# Patient Record
Sex: Female | Born: 1985 | Race: White | Hispanic: Yes | Marital: Married | Smoking: Former smoker
Health system: Southern US, Community
[De-identification: ages and names within clinical notes are randomized; demographics above are authoritative.]

## PROBLEM LIST (undated history)

## (undated) DIAGNOSIS — K8020 Calculus of gallbladder without cholecystitis without obstruction: Secondary | ICD-10-CM

## (undated) DIAGNOSIS — D649 Anemia, unspecified: Secondary | ICD-10-CM

## (undated) HISTORY — DX: Anemia, unspecified: D64.9

## (undated) HISTORY — PX: BREAST ENHANCEMENT SURGERY: SHX7

## (undated) HISTORY — DX: Calculus of gallbladder without cholecystitis without obstruction: K80.20

---

## 2013-01-16 ENCOUNTER — Ambulatory Visit: Payer: Self-pay | Admitting: Gastroenterology

## 2013-01-23 ENCOUNTER — Ambulatory Visit: Payer: Self-pay | Admitting: Gastroenterology

## 2013-03-21 ENCOUNTER — Encounter: Payer: Self-pay | Admitting: Gastroenterology

## 2013-04-03 ENCOUNTER — Ambulatory Visit: Payer: Self-pay | Admitting: Gastroenterology

## 2013-04-08 ENCOUNTER — Telehealth: Payer: Self-pay | Admitting: Gastroenterology

## 2013-05-28 ENCOUNTER — Encounter: Payer: Self-pay | Admitting: *Deleted

## 2013-05-29 ENCOUNTER — Ambulatory Visit (INDEPENDENT_AMBULATORY_CARE_PROVIDER_SITE_OTHER): Payer: No Typology Code available for payment source | Admitting: Gastroenterology

## 2013-05-29 ENCOUNTER — Encounter: Payer: Self-pay | Admitting: Gastroenterology

## 2013-05-29 VITALS — BP 90/60 | HR 80 | Ht 60.0 in | Wt 134.1 lb

## 2013-05-29 DIAGNOSIS — K602 Anal fissure, unspecified: Secondary | ICD-10-CM

## 2013-05-29 DIAGNOSIS — K648 Other hemorrhoids: Secondary | ICD-10-CM | POA: Insufficient documentation

## 2013-05-29 DIAGNOSIS — R1011 Right upper quadrant pain: Secondary | ICD-10-CM

## 2013-05-29 DIAGNOSIS — R109 Unspecified abdominal pain: Secondary | ICD-10-CM

## 2013-05-29 MED ORDER — HYOSCYAMINE SULFATE ER 0.375 MG PO TBCR: EXTENDED_RELEASE_TABLET | ORAL | Status: DC

## 2013-05-29 MED ORDER — AMBULATORY NON FORMULARY MEDICATION: Status: DC

## 2013-05-29 NOTE — Assessment & Plan Note (Signed)
Rectal pain and bleeding is 2 to a fissure.  Recommendations #1 fiber supplementation #2 warm soaks #31% nitroglycerin ointment 4 times a day

## 2013-05-29 NOTE — Addendum Note (Signed)
Addended by: Marlowe KaysSTALLINGS, Aldred Mase M on: 05/29/2013 09:49 AM   Modules accepted: Orders

## 2013-05-29 NOTE — Progress Notes (Signed)
_                                                                                                                History of Present Illness:  28 year old female referred for evaluation of abdominal pain and rectal bleeding.  She has been symptomatic of both for at least 4 months.  She complains of pain in the right abdomen both in the right lower quadrant and radiated up to the upper quadrant and through and around to her back.  It is unrelated to meals or bowel movements.  It is described as a pounding pain that may occur spontaneously and occasionally awakens her.  By report abdominal ultrasound demonstrated cholelithiasis.  Lab work was pertinent for a mild microcytic anemia with hemoglobin 12.3.  LFTs were normal.  She complains of occasional immediate postprandial nausea with vomiting.  Bowels are erratic and consists of alternating episodes of constipation and diarrhea.  With a bowel movement she may have burning rectal discomfort and pass small clots or blood on the toilet tissue.  Upper endoscopy in December was normal.  Colonoscopy to the terminal ileum was also unremarkable.    Past Medical History  Diagnosis Date  . Anemia   . Gallstones    Past Surgical History  Procedure Laterality Date  . Breast enhancement surgery     family history includes Diabetes in her mother; Irritable bowel syndrome in her mother; Liver cancer in her maternal aunt; Ovarian cancer in her maternal aunt; Pancreatic cancer in her maternal aunt. Current Outpatient Prescriptions  Medication Sig Dispense Refill  . ondansetron (ZOFRAN-ODT) 8 MG disintegrating tablet Take 8 mg by mouth every 6 (six) hours as needed for nausea or vomiting.       No current facility-administered medications for this visit.   Allergies as of 05/29/2013 - Review Complete 05/29/2013  Allergen Reaction Noted  . Peanuts [peanut oil]  05/29/2013    reports that she has been smoking Cigarettes.  She has been smoking  about 0.10 packs per day. She has never used smokeless tobacco. She reports that she does not drink alcohol or use illicit drugs.     Review of Systems: Pertinent positive and negative review of systems were noted in the above HPI section. All other review of systems were otherwise negative.  Vital signs were reviewed in today's medical record Physical Exam: General: Well developed , well nourished, no acute distress Skin: anicteric Head: Normocephalic and atraumatic Eyes:  sclerae anicteric, EOMI Ears: Normal auditory acuity Mouth: No deformity or lesions Neck: Supple, no masses or thyromegaly Lungs: Clear throughout to auscultation Heart: Regular rate and rhythm; no murmurs, rubs or bruits Abdomen: Soft, and non distended. No masses, hepatosplenomegaly or hernias noted. Normal Bowel sounds.  There is mild tenderness in the right periumbilical area and right upper quadrant without guarding or rebound Rectal: There are no external abnormalities Musculoskeletal: Symmetrical with no gross deformities  Skin: No lesions on visible extremities Pulses:  Normal pulses noted Extremities: No clubbing, cyanosis,  edema or deformities noted Neurological: Alert oriented x 4, grossly nonfocal Cervical Nodes:  No significant cervical adenopathy Inguinal Nodes: No significant inguinal adenopathy Psychological:  Alert and cooperative. Normal mood and affect  Anoscopy was performed.  This demonstrated a fissure in the posterior midline and internal hemorrhoids  See Assessment and Plan under Problem List

## 2013-05-29 NOTE — Patient Instructions (Addendum)
You have been given a separate informational sheet regarding your tobacco use, the importance of quitting and local resources to help you quit. You have been scheduled for a [facility]IDA scan [facility] (1st floor) on 06/04/2013. Please arrive 15 minutes prior to your scheduled appointment at  1:61WR9:30am. Make certain not to have anything to eat or drink at least 6 hours prior to your test. Should this appointment date or time not work well for you, please call radiology scheduling at [phone].  _____________________________________________________________________ hepatobiliary (HIDA) scan is an imaging procedure used to diagnose problems in the liver, gallbladder and bile ducts. In the HIDA scan, a radioactive chemical or tracer is injected into a vein in your arm. The tracer is handled by the liver like bile. Bile is a fluid produced and excreted by your liver that helps your digestive system break down fats in the foods you eat. Bile is stored in your gallbladder and the gallbladder releases the bile when you eat a meal. A special nuclear medicine scanner (gamma camera) tracks the flow of the tracer from your liver into your gallbladder and small intestine.  During your HIDA scan  You'll be asked to change into a hospital gown before your HIDA scan begins. Your health care team will position you on a table, usually on your back. The radioactive tracer is then injected into a vein in your arm.The tracer travels through your bloodstream to your liver, where it's taken up by the bile-producing cells. The radioactive tracer travels with the bile from your liver into your gallbladder and through your bile ducts to your small intestine.You may feel some pressure while the radioactive tracer is injected into your vein. As you lie on the table, a special gamma camera is positioned over your abdomen taking pictures of the tracer as it moves through your body. The gamma camera takes pictures continually for about  an hour. You'll need to keep still during the HIDA scan. This can become uncomfortable, but you may find that you can lessen the discomfort by taking deep breaths and thinking about other things. Tell your health care team if you're uncomfortable. The radiologist will watch on a computer the progress of the radioactive tracer through your body. The HIDA scan may be stopped when the radioactive tracer is seen in the gallbladder and enters your small intestine. This typically takes about an hour. In some cases extra imaging will be performed if original images aren't satisfactory, if morphine is given to help visualize the gallbladder or if the medication CCK is given to look at the contraction of the gallbladder. This test typically takes 2 hours to complete. _____________________________________________________High-Fiber Diet Fiber is found in fruits, vegetables, and grains. A high-fiber diet encourages the addition of more whole grains, legumes, fruits, and vegetables in your diet. The recommended amount of fiber for adult males is 38 g per day. For adult females, it is 25 g per day. Pregnant and lactating women should get 28 g of fiber per day. If you have a digestive or bowel problem, ask your caregiver for advice before adding high-fiber foods to your diet. Eat a variety of high-fiber foods instead of only a select few type of foods.  PURPOSE  To increase stool bulk.  To make bowel movements more regular to prevent constipation.  To lower cholesterol.  To prevent overeating. WHEN IS THIS DIET USED?  It may be used if you have constipation and hemorrhoids.  It may be used if you have  uncomplicated diverticulosis (intestine condition) and irritable bowel syndrome.  It may be used if you need help with weight management.  It may be used if you want to add it to your diet as a protective measure against atherosclerosis, diabetes, and cancer. SOURCES OF FIBER  Whole-grain breads and  cereals.  Fruits, such as apples, oranges, bananas, berries, prunes, and pears.  Vegetables, such as green peas, carrots, sweet potatoes, beets, broccoli, cabbage, spinach, and artichokes.  Legumes, such split peas, soy, lentils.  Almonds. FIBER CONTENT IN FOODS Starches and Grains / Dietary Fiber (g)  Cheerios, 1 cup / 3 g  Corn Flakes cereal, 1 cup / 0.7 g  Rice crispy treat cereal, 1 cup / 0.3 g  Instant oatmeal (cooked),  cup / 2 g  Frosted wheat cereal, 1 cup / 5.1 g  Brown, long-grain rice (cooked), 1 cup / 3.5 g  White, long-grain rice (cooked), 1 cup / 0.6 g  Enriched macaroni (cooked), 1 cup / 2.5 g Legumes / Dietary Fiber (g)  Baked beans (canned, plain, or vegetarian),  cup / 5.2 g  Kidney beans (canned),  cup / 6.8 g  Pinto beans (cooked),  cup / 5.5 g Breads and Crackers / Dietary Fiber (g)  Plain or honey graham crackers, 2 squares / 0.7 g  Saltine crackers, 3 squares / 0.3 g  Plain, salted pretzels, 10 pieces / 1.8 g  Whole-wheat bread, 1 slice / 1.9 g  White bread, 1 slice / 0.7 g  Raisin bread, 1 slice / 1.2 g  Plain bagel, 3 oz / 2 g  Flour tortilla, 1 oz / 0.9 g  Corn tortilla, 1 small / 1.5 g  Hamburger or hotdog bun, 1 small / 0.9 g Fruits / Dietary Fiber (g)  Apple with skin, 1 medium / 4.4 g  Sweetened applesauce,  cup / 1.5 g  Banana,  medium / 1.5 g  Grapes, 10 grapes / 0.4 g  Orange, 1 small / 2.3 g  Raisin, 1.5 oz / 1.6 g  Melon, 1 cup / 1.4 g Vegetables / Dietary Fiber (g)  Green beans (canned),  cup / 1.3 g  Carrots (cooked),  cup / 2.3 g  Broccoli (cooked),  cup / 2.8 g  Peas (cooked),  cup / 4.4 g  Mashed potatoes,  cup / 1.6 g  Lettuce, 1 cup / 0.5 g  Corn (canned),  cup / 1.6 g  Tomato,  cup / 1.1 g Document Released: 01/31/2005 Document Revised: 08/02/2011 Document Reviewed: 05/05/2011 Exit[employer] Patient Information 2014 [place]   _______________

## 2013-05-29 NOTE — Assessment & Plan Note (Signed)
Abdominal pain is nonspecific.  While she has gallbladder stones I am not certain whether she is symptomatic from this.  Recommendations #1 HIDA scan #2 trial of hyomax

## 2013-06-04 ENCOUNTER — Ambulatory Visit (HOSPITAL_COMMUNITY)
Admission: RE | Admit: 2013-06-04 | Discharge: 2013-06-04 | Disposition: A | Discharge status: Home / Self Care | Payer: No Typology Code available for payment source | Source: Ambulatory Visit | Attending: Gastroenterology | Admitting: Gastroenterology

## 2013-06-04 DIAGNOSIS — R109 Unspecified abdominal pain: Secondary | ICD-10-CM | POA: Insufficient documentation

## 2013-06-04 MED ORDER — TECHNETIUM TC 99M MEBROFENIN IV KIT
5.5000 | PACK | Freq: Once | INTRAVENOUS | Status: AC | PRN
  Administered 2013-06-04: 6 via INTRAVENOUS

## 2013-06-04 MED ORDER — SINCALIDE 5 MCG IJ SOLR
0.0200 ug/kg | Freq: Once | INTRAMUSCULAR | Status: AC
  Administered 2013-06-04: 1.2 ug via INTRAVENOUS

## 2013-06-05 ENCOUNTER — Other Ambulatory Visit: Payer: Self-pay

## 2013-06-05 DIAGNOSIS — R109 Unspecified abdominal pain: Secondary | ICD-10-CM

## 2013-06-11 NOTE — Telephone Encounter (Signed)
Pt seen

## 2013-06-14 ENCOUNTER — Other Ambulatory Visit: Payer: Self-pay

## 2013-06-14 ENCOUNTER — Encounter (HOSPITAL_COMMUNITY): Payer: Self-pay

## 2013-06-14 ENCOUNTER — Ambulatory Visit (HOSPITAL_COMMUNITY)
Admission: RE | Admit: 2013-06-14 | Discharge: 2013-06-14 | Disposition: A | Discharge status: Home / Self Care | Payer: No Typology Code available for payment source | Source: Ambulatory Visit | Attending: Gastroenterology | Admitting: Gastroenterology

## 2013-06-14 ENCOUNTER — Other Ambulatory Visit: Payer: Self-pay | Admitting: Gastroenterology

## 2013-06-14 DIAGNOSIS — R109 Unspecified abdominal pain: Secondary | ICD-10-CM

## 2013-06-14 DIAGNOSIS — R1011 Right upper quadrant pain: Secondary | ICD-10-CM | POA: Insufficient documentation

## 2013-06-14 DIAGNOSIS — K829 Disease of gallbladder, unspecified: Secondary | ICD-10-CM

## 2013-06-14 MED ORDER — TECHNETIUM TC 99M MEBROFENIN IV KIT
5.2000 | PACK | Freq: Once | INTRAVENOUS | Status: AC | PRN
  Administered 2013-06-14: 5.2 via INTRAVENOUS

## 2013-06-28 ENCOUNTER — Telehealth: Payer: Self-pay | Admitting: Gastroenterology

## 2013-06-28 NOTE — Telephone Encounter (Signed)
Spoke with Dr. Arlyce DiceKaplan and patient should go to ED. Patient notified and she will go to ED.

## 2013-06-28 NOTE — Telephone Encounter (Signed)
Spoke with patient and she states she is having abdominal pain that is worse than before. She reports she took Hyoscyamine 3 tablets at one time and it did not help the pain. Told patient her prescription is for 1 tablet BID as needed not 3 tablets. She also reports nausea and vomiting this AM. She states she took her Zofran but it only helps for about an hour. She is asking for a different "pain medication." Please, advise.

## 2013-07-03 ENCOUNTER — Observation Stay (HOSPITAL_COMMUNITY)
Admission: EM | Admit: 2013-07-03 | Discharge: 2013-07-05 | Disposition: A | Discharge status: Home / Self Care | Payer: No Typology Code available for payment source | Attending: Surgery | Admitting: Surgery

## 2013-07-03 ENCOUNTER — Encounter (INDEPENDENT_AMBULATORY_CARE_PROVIDER_SITE_OTHER): Payer: Self-pay | Admitting: General Surgery

## 2013-07-03 ENCOUNTER — Encounter (HOSPITAL_COMMUNITY): Payer: Self-pay | Admitting: Emergency Medicine

## 2013-07-03 ENCOUNTER — Ambulatory Visit (INDEPENDENT_AMBULATORY_CARE_PROVIDER_SITE_OTHER): Payer: No Typology Code available for payment source | Admitting: General Surgery

## 2013-07-03 ENCOUNTER — Telehealth (INDEPENDENT_AMBULATORY_CARE_PROVIDER_SITE_OTHER): Payer: Self-pay | Admitting: General Surgery

## 2013-07-03 VITALS — BP 111/70 | HR 73 | Temp 98.1°F | Resp 16 | Ht 60.0 in | Wt 134.0 lb

## 2013-07-03 DIAGNOSIS — K8010 Calculus of gallbladder with chronic cholecystitis without obstruction: Secondary | ICD-10-CM

## 2013-07-03 DIAGNOSIS — Z79899 Other long term (current) drug therapy: Secondary | ICD-10-CM | POA: Insufficient documentation

## 2013-07-03 DIAGNOSIS — F17200 Nicotine dependence, unspecified, uncomplicated: Secondary | ICD-10-CM | POA: Insufficient documentation

## 2013-07-03 DIAGNOSIS — K819 Cholecystitis, unspecified: Secondary | ICD-10-CM | POA: Diagnosis present

## 2013-07-03 DIAGNOSIS — K648 Other hemorrhoids: Secondary | ICD-10-CM | POA: Insufficient documentation

## 2013-07-03 DIAGNOSIS — K8020 Calculus of gallbladder without cholecystitis without obstruction: Secondary | ICD-10-CM

## 2013-07-03 DIAGNOSIS — K602 Anal fissure, unspecified: Secondary | ICD-10-CM | POA: Insufficient documentation

## 2013-07-03 DIAGNOSIS — R1110 Vomiting, unspecified: Secondary | ICD-10-CM

## 2013-07-03 DIAGNOSIS — E876 Hypokalemia: Secondary | ICD-10-CM | POA: Insufficient documentation

## 2013-07-03 LAB — COMPREHENSIVE METABOLIC PANEL
ALK PHOS: 94 U/L (ref 39–117)
ALT: 20 U/L (ref 0–35)
AST: 19 U/L (ref 0–37)
Albumin: 4.3 g/dL (ref 3.5–5.2)
BILIRUBIN TOTAL: 0.3 mg/dL (ref 0.3–1.2)
BUN: 10 mg/dL (ref 6–23)
CHLORIDE: 100 meq/L (ref 96–112)
CO2: 20 meq/L (ref 19–32)
CREATININE: 0.57 mg/dL (ref 0.50–1.10)
Calcium: 9.2 mg/dL (ref 8.4–10.5)
GLUCOSE: 85 mg/dL (ref 70–99)
POTASSIUM: 3.4 meq/L — AB (ref 3.7–5.3)
Sodium: 136 mEq/L — ABNORMAL LOW (ref 137–147)
Total Protein: 8.2 g/dL (ref 6.0–8.3)

## 2013-07-03 LAB — CBC WITH DIFFERENTIAL/PLATELET
Basophils Absolute: 0 10*3/uL (ref 0.0–0.1)
Basophils Relative: 0 % (ref 0–1)
Eosinophils Absolute: 0.1 10*3/uL (ref 0.0–0.7)
Eosinophils Relative: 1 % (ref 0–5)
HCT: 35.7 % — ABNORMAL LOW (ref 36.0–46.0)
HEMOGLOBIN: 12.1 g/dL (ref 12.0–15.0)
LYMPHS ABS: 2.8 10*3/uL (ref 0.7–4.0)
Lymphocytes Relative: 29 % (ref 12–46)
MCH: 28.9 pg (ref 26.0–34.0)
MCHC: 33.9 g/dL (ref 30.0–36.0)
MCV: 85.4 fL (ref 78.0–100.0)
MONO ABS: 0.5 10*3/uL (ref 0.1–1.0)
MONOS PCT: 5 % (ref 3–12)
NEUTROS ABS: 6.3 10*3/uL (ref 1.7–7.7)
NEUTROS PCT: 65 % (ref 43–77)
Platelets: 260 10*3/uL (ref 150–400)
RBC: 4.18 MIL/uL (ref 3.87–5.11)
RDW: 15.3 % (ref 11.5–15.5)
WBC: 9.7 10*3/uL (ref 4.0–10.5)

## 2013-07-03 LAB — POC URINE PREG, ED: PREG TEST UR: NEGATIVE

## 2013-07-03 LAB — LIPASE, BLOOD: LIPASE: 18 U/L (ref 11–59)

## 2013-07-03 MED ORDER — HEPARIN SODIUM (PORCINE) 5000 UNIT/ML IJ SOLN
5000.0000 [IU] | Freq: Three times a day (TID) | INTRAMUSCULAR | Status: DC
  Administered 2013-07-03 – 2013-07-04 (×2): 5000 [IU] via SUBCUTANEOUS
  Filled 2013-07-03 (×5): qty 1

## 2013-07-03 MED ORDER — HYDROMORPHONE HCL PF 1 MG/ML IJ SOLN
1.0000 mg | Freq: Once | INTRAMUSCULAR | Status: AC
  Administered 2013-07-03: 1 mg via INTRAVENOUS
  Filled 2013-07-03: qty 1

## 2013-07-03 MED ORDER — PANTOPRAZOLE SODIUM 40 MG IV SOLR
40.0000 mg | Freq: Every day | INTRAVENOUS | Status: DC
  Administered 2013-07-03: 40 mg via INTRAVENOUS
  Filled 2013-07-03 (×2): qty 40

## 2013-07-03 MED ORDER — HYDROMORPHONE HCL PF 1 MG/ML IJ SOLN
1.0000 mg | INTRAMUSCULAR | Status: DC | PRN
  Administered 2013-07-03 – 2013-07-04 (×3): 1 mg via INTRAVENOUS
  Filled 2013-07-03 (×4): qty 1

## 2013-07-03 MED ORDER — PROMETHAZINE HCL 25 MG/ML IJ SOLN
12.5000 mg | Freq: Once | INTRAMUSCULAR | Status: AC
  Administered 2013-07-03: 12.5 mg via INTRAVENOUS
  Filled 2013-07-03: qty 1

## 2013-07-03 MED ORDER — KCL IN DEXTROSE-NACL 20-5-0.45 MEQ/L-%-% IV SOLN
INTRAVENOUS | Status: DC
  Administered 2013-07-03: via INTRAVENOUS
  Filled 2013-07-03 (×2): qty 1000

## 2013-07-03 MED ORDER — DIPHENHYDRAMINE HCL 12.5 MG/5ML PO ELIX: 12.5000 mg | ORAL_SOLUTION | Freq: Four times a day (QID) | ORAL | Status: DC | PRN

## 2013-07-03 MED ORDER — SODIUM CHLORIDE 0.9 % IV BOLUS (SEPSIS)
500.0000 mL | Freq: Once | INTRAVENOUS | Status: AC
  Administered 2013-07-03: 500 mL via INTRAVENOUS

## 2013-07-03 MED ORDER — ONDANSETRON HCL 4 MG/2ML IJ SOLN
4.0000 mg | Freq: Once | INTRAMUSCULAR | Status: AC
  Administered 2013-07-03: 4 mg via INTRAVENOUS
  Filled 2013-07-03: qty 2

## 2013-07-03 MED ORDER — ONDANSETRON HCL 4 MG/2ML IJ SOLN
4.0000 mg | Freq: Four times a day (QID) | INTRAMUSCULAR | Status: DC | PRN
Start: 2013-07-03 — End: 2013-07-04
  Administered 2013-07-03 – 2013-07-04 (×2): 4 mg via INTRAVENOUS
  Filled 2013-07-03 (×2): qty 2

## 2013-07-03 MED ORDER — DIPHENHYDRAMINE HCL 50 MG/ML IJ SOLN: 12.5000 mg | Freq: Four times a day (QID) | INTRAMUSCULAR | Status: DC | PRN

## 2013-07-03 NOTE — Patient Instructions (Signed)
Laparoscopic Cholecystectomy °Laparoscopic cholecystectomy is surgery to remove the gallbladder. The gallbladder is located in the upper right part of the abdomen, behind the liver. It is a storage sac for bile produced in the liver. Bile aids in the digestion and absorption of fats. Cholecystectomy is often done for inflammation of the gallbladder (cholecystitis). This condition is usually caused by a buildup of gallstones (cholelithiasis) in your gallbladder. Gallstones can block the flow of bile, resulting in inflammation and pain. In severe cases, emergency surgery may be required. When emergency surgery is not required, you will have time to prepare for the procedure. °Laparoscopic surgery is an alternative to open surgery. Laparoscopic surgery has a shorter recovery time. Your common bile duct may also need to be examined during the procedure. If stones are found in the common bile duct, they may be removed. °LET YOUR HEALTH CARE PROVIDER KNOW ABOUT: °· Any allergies you have. °· All medicines you are taking, including vitamins, herbs, eye drops, creams, and over-the-counter medicines. °· Previous problems you or members of your family have had with the use of anesthetics. °· Any blood disorders you have. °· Previous surgeries you have had. °· Medical conditions you have. °RISKS AND COMPLICATIONS °Generally, this is a safe procedure. However, as with any procedure, complications can occur. Possible complications include: °· Infection. °· Damage to the common bile duct, nerves, arteries, veins, or other internal organs such as the stomach, liver, or intestines. °· Bleeding. °· A stone may remain in the common bile duct. °· A bile leak from the cyst duct that is clipped when your gallbladder is removed. °· The need to convert to open surgery, which requires a larger incision in the abdomen. This may be necessary if your surgeon thinks it is not safe to continue with a laparoscopic procedure. °BEFORE THE  PROCEDURE °· Ask your health care provider about changing or stopping any regular medicines. You will need to stop taking aspirin or blood thinners at least 5 days prior to surgery. °· Do not eat or drink anything after midnight the night before surgery. °· Let your health care provider know if you develop a cold or other infectious problem before surgery. °PROCEDURE  °· You will be given medicine to make you sleep through the procedure (general anesthetic). A breathing tube will be placed in your mouth. °· When you are asleep, your surgeon will make several small cuts (incisions) in your abdomen. °· A thin, lighted tube with a tiny camera on the end (laparoscope) is inserted through one of the small incisions. The camera on the laparoscope sends a picture to a TV screen in the operating room. This gives the surgeon a good view inside your abdomen. °· A gas will be pumped into your abdomen. This expands your abdomen so that the surgeon has more room to perform the surgery. °· Other tools needed for the procedure are inserted through the other incisions. The gallbladder is removed through one of the incisions. °· After the removal of your gallbladder, the incisions will be closed with stitches, staples, or skin glue. °AFTER THE PROCEDURE °· You will be taken to a recovery area where your progress will be checked often. °· You may be allowed to go home the same day if your pain is controlled and you can tolerate liquids. °Document Released: 01/31/2005 Document Revised: 11/21/2012 Document Reviewed: 09/12/2012 °ExitCare® Patient Information ©2014 [employer]. ° °

## 2013-07-03 NOTE — ED Provider Notes (Signed)
Medical screening examination/treatment/procedure(s) were performed by non-physician practitioner and as supervising physician I was immediately available for consultation/collaboration.   EKG Interpretation None        Layla MawKristen N Aleyna Cueva, DO 07/03/13 [id number]

## 2013-07-03 NOTE — Telephone Encounter (Signed)
pt cannot afford deposit for surgery will call back to schedule Gave information on [employer] / orders on file

## 2013-07-03 NOTE — H&P (Signed)
Chief Complaint:  midepigastric abdominal pain and vomiting  History of Present Illness:  Anna Hill is an 28 y.o. female who is seen in the ER tonight.  She was seen earlier in the day by Dr. Greer Pickerel: 28 year old female referred by Dr. Deatra Ina for evaluation of abdominal pain. She states that she's been having abdominal pain for about a year. She describes it as starting in her upper midline and radiating underneath her right rib cage. He'll also get her back and shoulder. It occurs on a daily basis. It will occur at random times throughout the day. Generally more pronounced in the mornings. Some days are worse than others. It lasts for various amounts of time but generally resolves within 1 hour. She does experience postprandial nausea. She does have occasional emesis. She states her weight has been fluctuating she has been afraid to eat because of the discomfort she will get after eating. She is currently being treated for internal hemorrhoids as well as an anal fissure therefore her bowel movements are low but irregular. She denies any acholic stools. She denies any jaundice. She denies any fever or chills. She takes NSAIDs on an occasional basis. She states that she's had a upper endoscopy which was reportedly normal. She's underwent an ultrasound as well as to nuclear medicine scans of her gallbladder. She states she will only get relief if she curls up into a ball.  She states tonight that she has continued vomiting.     Past Medical History  Diagnosis Date  . Anemia   . Gallstones     Past Surgical History  Procedure Laterality Date  . Breast enhancement surgery      No current facility-administered medications for this encounter.   Current Outpatient Prescriptions  Medication Sig Dispense Refill  . CRYSELLE-28 0.3-30 MG-MCG tablet       . hyoscyamine (LEVBID) 0.375 MG 12 hr tablet Take 0.375 mg by mouth See admin instructions. 0.375 mg twice daily for 5 days then as needed for  cramps      . ondansetron (ZOFRAN-ODT) 8 MG disintegrating tablet Take 8 mg by mouth every 6 (six) hours as needed for nausea or vomiting.      Marland Kitchen PRESCRIPTION MEDICATION Apply 1 application topically as needed (pain). Nitroglycerin gel [facility]      . AMBULATORY NON FORMULARY MEDICATION Medication Name: Nitroglycerin gel apply as needed       Peanuts Family History  Problem Relation Age of Onset  . Liver cancer Maternal Aunt   . Diabetes Mother   . Irritable bowel syndrome Mother   . Ovarian cancer Maternal Aunt   . Pancreatic cancer Maternal Aunt    Social History:   reports that she has been smoking Cigarettes.  She has been smoking about 0.25 packs per day. She has never used smokeless tobacco. She reports that she does not drink alcohol or use illicit drugs.   REVIEW OF SYSTEMS - PERTINENT POSITIVES ONLY: Non contributory  Physical Exam:   Blood pressure 123/88, pulse 94, temperature 99.1 F (37.3 C), temperature source Oral, resp. rate 16, last menstrual period 06/25/2013, SpO2 95.00%. There is no weight on file to calculate BMI.  Gen:  WDWN Hispanic female NAD  Neurological: Alert and oriented to person, place, and time. Motor and sensory function is grossly intact  Head: Normocephalic and atraumatic.  Eyes: Conjunctivae are normal. Pupils are equal, round, and reactive to light. No scleral icterus.  Neck: Normal range of motion. Neck supple. No  tracheal deviation or thyromegaly present.  Cardiovascular:  SR without murmurs or gallops.  No carotid bruits Respiratory: Effort normal.  No respiratory distress. No chest wall tenderness. Breath sounds normal.  No wheezes, rales or rhonchi.  Abdomen:  Tender below xiphoid GU: Musculoskeletal: Normal range of motion. Extremities are nontender. No cyanosis, edema or clubbing noted Lymphadenopathy: No cervical, preauricular, postauricular or axillary adenopathy is present Skin: Skin is warm and dry. No rash noted. No diaphoresis. No  erythema. No pallor. Pscyh: Normal mood and affect. Behavior is normal. Judgment and thought content normal.   LABORATORY RESULTS: Results for orders placed during the hospital encounter of 07/03/13 (from the past 48 hour(s))  CBC WITH DIFFERENTIAL     Status: Abnormal   Collection Time    07/03/13  7:18 PM      Result Value Ref Range   WBC 9.7  4.0 - 10.5 K/uL   RBC 4.18  3.87 - 5.11 MIL/uL   Hemoglobin 12.1  12.0 - 15.0 g/dL   HCT 35.7 (*) 36.0 - 46.0 %   MCV 85.4  78.0 - 100.0 fL   MCH 28.9  26.0 - 34.0 pg   MCHC 33.9  30.0 - 36.0 g/dL   RDW 15.3  11.5 - 15.5 %   Platelets 260  150 - 400 K/uL   Neutrophils Relative % 65  43 - 77 %   Neutro Abs 6.3  1.7 - 7.7 K/uL   Lymphocytes Relative 29  12 - 46 %   Lymphs Abs 2.8  0.7 - 4.0 K/uL   Monocytes Relative 5  3 - 12 %   Monocytes Absolute 0.5  0.1 - 1.0 K/uL   Eosinophils Relative 1  0 - 5 %   Eosinophils Absolute 0.1  0.0 - 0.7 K/uL   Basophils Relative 0  0 - 1 %   Basophils Absolute 0.0  0.0 - 0.1 K/uL  COMPREHENSIVE METABOLIC PANEL     Status: Abnormal   Collection Time    07/03/13  7:18 PM      Result Value Ref Range   Sodium 136 (*) 137 - 147 mEq/L   Potassium 3.4 (*) 3.7 - 5.3 mEq/L   Chloride 100  96 - 112 mEq/L   CO2 20  19 - 32 mEq/L   Glucose, Bld 85  70 - 99 mg/dL   BUN 10  6 - 23 mg/dL   Creatinine, Ser 0.57  0.50 - 1.10 mg/dL   Calcium 9.2  8.4 - 10.5 mg/dL   Total Protein 8.2  6.0 - 8.3 g/dL   Albumin 4.3  3.5 - 5.2 g/dL   AST 19  0 - 37 U/L   ALT 20  0 - 35 U/L   Alkaline Phosphatase 94  39 - 117 U/L   Total Bilirubin 0.3  0.3 - 1.2 mg/dL   GFR calc non Af Amer >90  >90 mL/min   GFR calc Af Amer >90  >90 mL/min   Comment: (NOTE)     The eGFR has been calculated using the CKD EPI equation.     This calculation has not been validated in all clinical situations.     eGFR's persistently <90 mL/min signify possible Chronic Kidney     Disease.  LIPASE, BLOOD     Status: None   Collection Time    07/03/13   7:18 PM      Result Value Ref Range   Lipase 18  11 - 59 U/L  POC  URINE PREG, ED     Status: None   Collection Time    07/03/13  7:44 PM      Result Value Ref Range   Preg Test, Ur NEGATIVE  NEGATIVE   Comment:            THE SENSITIVITY OF THIS     METHODOLOGY IS >24 mIU/mL    RADIOLOGY RESULTS: No results found.  Problem List: Patient Active Problem List   Diagnosis Date Noted  . Chronic cholecystitis with calculus 07/03/2013  . Abdominal pain, right upper quadrant 05/29/2013  . Anal fissure 05/29/2013  . Internal hemorrhoids without mention of complication 47/84/1282    Assessment & Plan: Mid epigastric pain in a patient with a positive HIDA scan earlier in the month suggesting cystic duct occlusion. Her pain does radiate into her back but her lipase is normal. We'll admit for probable laparoscopic cholecystectomy tomorrow    Matt B. Hassell Done, MD, [facility] [phone] beeper [phone]  07/03/2013 9:45 PM

## 2013-07-03 NOTE — ED Notes (Signed)
MD at bedside. 

## 2013-07-03 NOTE — Progress Notes (Signed)
Patient ID: Anna Hill, female   DOB: 1985-07-29, 28 y.o.   MRN: [id number]  Chief Complaint  Patient presents with  . Abdominal Pain    gallbladder    HPI Anna Hill is a 28 y.o. female.   HPI 28 year old female referred by Dr. Arlyce DiceKaplan for evaluation of abdominal pain. She states that she's been having abdominal pain for about a year. She describes it as starting in her upper midline and radiating underneath her right rib cage. He'll also get her back and shoulder. It occurs on a daily basis. It will occur at random times throughout the day. Generally more pronounced in the mornings. Some days are worse than others. It lasts for various amounts of time but generally resolves within 1 hour. She does experience postprandial nausea. She does have occasional emesis. She states her weight has been fluctuating she has been afraid to eat because of the discomfort she will get after eating. She is currently being treated for internal hemorrhoids as well as an anal fissure therefore her bowel movements are low but irregular. She denies any acholic stools. She denies any jaundice. She denies any fever or chills. She takes NSAIDs on an occasional basis. She states that she's had a upper endoscopy which was reportedly normal. She's underwent an ultrasound as well as to nuclear medicine scans of her gallbladder. She states she will only get relief if she curls up into a ball. Past Medical History  Diagnosis Date  . Anemia   . Gallstones     Past Surgical History  Procedure Laterality Date  . Breast enhancement surgery      Family History  Problem Relation Age of Onset  . Liver cancer Maternal Aunt   . Diabetes Mother   . Irritable bowel syndrome Mother   . Ovarian cancer Maternal Aunt   . Pancreatic cancer Maternal Aunt     Social History History  Substance Use Topics  . Smoking status: Current Some Day Smoker -- 1.00 packs/day    Types: Cigarettes  . Smokeless tobacco: Never Used  .  Alcohol Use: No    Allergies  Allergen Reactions  . Peanuts [Peanut Oil]     Current Outpatient Prescriptions  Medication Sig Dispense Refill  . AMBULATORY NON FORMULARY MEDICATION Medication Name: Nitroglycerin gel apply as needed  30 g  1  . CRYSELLE-28 0.3-30 MG-MCG tablet       . Hyoscyamine Sulfate 0.375 MG TBCR Take one tab twice a day for 5 days then as needed, abdominal pain  25 tablet  1  . ondansetron (ZOFRAN-ODT) 8 MG disintegrating tablet Take 8 mg by mouth every 6 (six) hours as needed for nausea or vomiting.       No current facility-administered medications for this visit.    Review of Systems Review of Systems  Constitutional: Positive for fatigue. Negative for fever, activity change, appetite change and unexpected weight change.  HENT: Negative for nosebleeds and trouble swallowing.   Eyes: Negative for photophobia and visual disturbance.  Respiratory: Negative for chest tightness and shortness of breath.   Cardiovascular: Negative for chest pain and leg swelling.       Denies CP, SOB, orthopnea, PND, some DOE  Genitourinary: Negative for dysuria and difficulty urinating.  Musculoskeletal: Negative for arthralgias.  Skin: Negative for pallor and rash.  Neurological: Negative for dizziness, seizures, facial asymmetry and numbness.          Hematological: Negative for adenopathy. Does not bruise/bleed easily.  Psychiatric/Behavioral: Negative for  behavioral problems and agitation.    Blood pressure 111/70, pulse 73, temperature 98.1 F (36.7 C), temperature source Temporal, resp. rate 16, height 5' (1.524 m), weight 134 lb (60.782 kg).  Physical Exam Physical Exam  Vitals reviewed. Constitutional: She is oriented to person, place, and time. She appears well-developed and well-nourished. No distress.  HENT:  Head: Normocephalic and atraumatic.  Right Ear: External ear normal.  Left Ear: External ear normal.  Eyes: Conjunctivae are normal. No scleral  icterus.  Neck: Normal range of motion. Neck supple. No tracheal deviation present. No thyromegaly present.  Cardiovascular: Normal rate and normal heart sounds.   Pulmonary/Chest: Effort normal and breath sounds normal. No stridor. No respiratory distress. She has no wheezes.  Abdominal: Soft. Normal appearance. She exhibits no distension. There is tenderness (mild) in the right upper quadrant and epigastric area. There is no rigidity, no rebound and no guarding.  Musculoskeletal: She exhibits no edema and no tenderness.  Lymphadenopathy:    She has no cervical adenopathy.  Neurological: She is alert and oriented to person, place, and time. She exhibits normal muscle tone.  Skin: Skin is warm and dry. No rash noted. She is not diaphoretic. No erythema.  Psychiatric: She has a normal mood and affect. Her behavior is normal. Judgment and thought content normal.    Data Reviewed Dr Marzetta BoardKaplan's note [facility] u/s - multiple GS, nml duct Labs 03/2013 - nml cmet; hgb 12; hct 38 HIDA x 2 - nonvisualization of GB  Assessment    Chronic cholecystitis with calculous     Plan    I believe the patient's symptoms are consistent with gallbladder disease.  We discussed gallbladder disease. The patient was given Agricultural engineereducational material. We discussed non-operative and operative management. We discussed the signs & symptoms of acute cholecystitis  I discussed laparoscopic cholecystectomy with IOC in detail.  The patient was given educational material as well as diagrams detailing the procedure.  We discussed the risks and benefits of a laparoscopic cholecystectomy including, but not limited to bleeding, infection, injury to surrounding structures such as the intestine or liver, bile leak, retained gallstones, need to convert to an open procedure, prolonged diarrhea, blood clots such as  DVT, common bile duct injury, anesthesia risks, and possible need for additional procedures.  We discussed the typical  post-operative recovery course. I explained that the likelihood of improvement of their symptoms is good.  I explained that after surgery and a lot of times patients can get constipation as result of pain medication. We discussed the importance of trying to remain normal with respect her bowel movements after surgery given her anal fissure. We discussed the importance of stool softeners, sitz baths, and laxatives as needed.  She will meet with the schedulers today to schedule surgery.  Mary SellaEric M. Andrey CampanileWilson, MD, FACS General, Bariatric, & Minimally Invasive Surgery [facility]          Atilano Inaric M Aymee Fomby 07/03/2013, 9:06 AM

## 2013-07-03 NOTE — ED Notes (Signed)
Pt c/o RUQ pain d/t gallbladder issues.  Is supposed to schedule her surgery tomorrow.  Vomiting in triage.  Pain and vomiting x 1 yr.

## 2013-07-03 NOTE — ED Provider Notes (Signed)
CSN: [id number]     Arrival date & time 07/03/13  1728 History   First MD Initiated Contact with Patient 07/03/13 1810     Chief Complaint  Patient presents with  . Abdominal Pain  . Nausea  . Emesis     (Consider location/radiation/quality/duration/timing/severity/associated sxs/prior Treatment) HPI Comments: Patient with history of HIDA scan x2 in 05/2013 suggesting chronic cholecystitis and gallstones -- presents with persistent abdominal pain and vomiting for the past 2 days. Patient states that she has been vomiting frequently. She has awoke in the middle the night to vomit. Vomiting is yellow, nonbloody. Abdominal pain is in the epigastrium and right upper quadrant radiating to her back into her right shoulder. She denies fever. She has had diarrhea today. No dysuria or change in urination. Onset of symptoms gradual. Course is persistent. Nothing makes symptoms better or worse.  Patient is followed by Dr. Andrey CampanileWilson. Patient has plans to meet with surgery scheduler tomorrow to schedule her laparoscopic cholecystectomy.  Patient is a 28 y.o. female presenting with abdominal pain and vomiting. The history is provided by the patient and medical records.  Abdominal Pain Associated symptoms: nausea and vomiting   Associated symptoms: no chest pain, no cough, no diarrhea, no dysuria, no fever and no sore throat   Emesis Associated symptoms: abdominal pain   Associated symptoms: no diarrhea, no headaches, no myalgias and no sore throat     Past Medical History  Diagnosis Date  . Anemia   . Gallstones    Past Surgical History  Procedure Laterality Date  . Breast enhancement surgery     Family History  Problem Relation Age of Onset  . Liver cancer Maternal Aunt   . Diabetes Mother   . Irritable bowel syndrome Mother   . Ovarian cancer Maternal Aunt   . Pancreatic cancer Maternal Aunt    History  Substance Use Topics  . Smoking status: Current Some Day Smoker -- 1.00 packs/day   Types: Cigarettes  . Smokeless tobacco: Never Used  . Alcohol Use: No   OB History   Grav Para Term Preterm Abortions TAB SAB Ect Mult Living                 Review of Systems  Constitutional: Negative for fever.  HENT: Negative for rhinorrhea and sore throat.   Eyes: Negative for redness.  Respiratory: Negative for cough.   Cardiovascular: Negative for chest pain.  Gastrointestinal: Positive for nausea, vomiting and abdominal pain. Negative for diarrhea.  Genitourinary: Negative for dysuria.  Musculoskeletal: Negative for myalgias.  Skin: Negative for rash.  Neurological: Negative for headaches.   Allergies  Peanuts  Home Medications   Prior to Admission medications   Medication Sig Start Date End Date Taking? Authorizing Provider  AMBULATORY NON FORMULARY MEDICATION Medication Name: Nitroglycerin gel apply as needed 05/29/13   Louis Meckelobert D Kaplan, MD  CRYSELLE-28 0.3-30 MG-MCG tablet  06/06/13   Historical Provider, MD  Hyoscyamine Sulfate 0.375 MG TBCR Take one tab twice a day for 5 days then as needed, abdominal pain 05/29/13   Louis Meckelobert D Kaplan, MD  ondansetron (ZOFRAN-ODT) 8 MG disintegrating tablet Take 8 mg by mouth every 6 (six) hours as needed for nausea or vomiting.    Historical Provider, MD   BP 116/86  Pulse 86  Temp(Src) 98.7 F (37.1 C) (Oral)  Resp 20  SpO2 99%  Physical Exam  Nursing note and vitals reviewed. Constitutional: She appears well-developed and well-nourished.  HENT:  Head: Normocephalic and  atraumatic.  Eyes: Conjunctivae are normal. Right eye exhibits no discharge. Left eye exhibits no discharge.  Neck: Normal range of motion. Neck supple.  Cardiovascular: Normal rate, regular rhythm and normal heart sounds.   No murmur heard. Pulmonary/Chest: Effort normal and breath sounds normal. No respiratory distress. She has no wheezes. She has no rales.  Abdominal: Soft. Bowel sounds are decreased. There is tenderness in the right upper quadrant and  epigastric area. There is no rigidity, no rebound, no guarding, no CVA tenderness and no tenderness at McBurney's point.  Neurological: She is alert.  Skin: Skin is warm and dry.  Psychiatric: She has a normal mood and affect.    ED Course  Procedures (including critical care time) Labs Review Labs Reviewed  CBC WITH DIFFERENTIAL - Abnormal; Notable for the following:    HCT 35.7 (*)    All other components within normal limits  COMPREHENSIVE METABOLIC PANEL - Abnormal; Notable for the following:    Sodium 136 (*)    Potassium 3.4 (*)    All other components within normal limits  LIPASE, BLOOD  POC URINE PREG, ED    Imaging Review No results found.   EKG Interpretation None      6:18 PM Patient seen and examined. Work-up initiated. Medications ordered.   Vital signs reviewed and are as follows: Filed Vitals:   07/03/13 1801  BP: 116/86  Pulse: 86  Temp: 98.7 F (37.1 C)  Resp: 20   8:50 PM Patient continues to have vomiting and pain. She requests additional medications.   Patient discussed with Dr. Elesa MassedWard.  I have spoken with Dr. Daphine DeutscherMartin and asked for consult. He agrees to see patient. Additional medications ordered.   10:12 PM Patient admitted by surgery.   MDM   Final diagnoses:  Symptomatic cholelithiasis  Intractable vomiting   Admit for treatment of intractable symptoms.     Renne CriglerJoshua Jessicaann Overbaugh, PA-C 07/03/13 2212

## 2013-07-04 ENCOUNTER — Observation Stay (HOSPITAL_COMMUNITY): Payer: No Typology Code available for payment source | Admitting: Certified Registered"

## 2013-07-04 ENCOUNTER — Encounter (HOSPITAL_COMMUNITY)
Admission: EM | Disposition: A | Discharge status: Home / Self Care | Payer: Self-pay | Source: Home / Self Care | Attending: Emergency Medicine

## 2013-07-04 ENCOUNTER — Observation Stay (HOSPITAL_COMMUNITY): Payer: No Typology Code available for payment source

## 2013-07-04 ENCOUNTER — Encounter (HOSPITAL_COMMUNITY): Payer: No Typology Code available for payment source | Admitting: Certified Registered"

## 2013-07-04 ENCOUNTER — Encounter (HOSPITAL_COMMUNITY): Payer: Self-pay | Admitting: Certified Registered"

## 2013-07-04 DIAGNOSIS — K8010 Calculus of gallbladder with chronic cholecystitis without obstruction: Secondary | ICD-10-CM

## 2013-07-04 HISTORY — PX: CHOLECYSTECTOMY: SHX55

## 2013-07-04 LAB — COMPREHENSIVE METABOLIC PANEL
ALBUMIN: 3.8 g/dL (ref 3.5–5.2)
ALT: 184 U/L — ABNORMAL HIGH (ref 0–35)
AST: 259 U/L — ABNORMAL HIGH (ref 0–37)
Alkaline Phosphatase: 95 U/L (ref 39–117)
BUN: 6 mg/dL (ref 6–23)
CO2: 24 mEq/L (ref 19–32)
Calcium: 8.8 mg/dL (ref 8.4–10.5)
Chloride: 104 mEq/L (ref 96–112)
Creatinine, Ser: 0.54 mg/dL (ref 0.50–1.10)
GFR calc Af Amer: >90 mL/min (ref >90)
GFR calc non Af Amer: >90 mL/min (ref >90)
Glucose, Bld: 100 mg/dL — ABNORMAL HIGH (ref 70–99)
Potassium: 3.6 mEq/L — ABNORMAL LOW (ref 3.7–5.3)
Sodium: 139 mEq/L (ref 137–147)
TOTAL PROTEIN: 7.3 g/dL (ref 6.0–8.3)
Total Bilirubin: 0.7 mg/dL (ref 0.3–1.2)

## 2013-07-04 LAB — CBC
HCT: 32.5 % — ABNORMAL LOW (ref 36.0–46.0)
Hemoglobin: 10.6 g/dL — ABNORMAL LOW (ref 12.0–15.0)
MCH: 28.6 pg (ref 26.0–34.0)
MCHC: 32.6 g/dL (ref 30.0–36.0)
MCV: 87.8 fL (ref 78.0–100.0)
Platelets: 217 10*3/uL (ref 150–400)
RBC: 3.7 MIL/uL — AB (ref 3.87–5.11)
RDW: 15.4 % (ref 11.5–15.5)
WBC: 8.9 10*3/uL (ref 4.0–10.5)

## 2013-07-04 LAB — SURGICAL PCR SCREEN
MRSA, PCR: NEGATIVE
Staphylococcus aureus: NEGATIVE

## 2013-07-04 SURGERY — LAPAROSCOPIC CHOLECYSTECTOMY WITH INTRAOPERATIVE CHOLANGIOGRAM
Anesthesia: General | Site: Abdomen

## 2013-07-04 MED ORDER — PROMETHAZINE HCL 25 MG/ML IJ SOLN
6.2500 mg | Freq: Four times a day (QID) | INTRAMUSCULAR | Status: DC | PRN
  Administered 2013-07-04: 6.25 mg via INTRAVENOUS
  Filled 2013-07-04: qty 1

## 2013-07-04 MED ORDER — LACTATED RINGERS IV SOLN
INTRAVENOUS | Status: DC
  Administered 2013-07-04: 1000 mL via INTRAVENOUS
  Administered 2013-07-04: 16:00:00 via INTRAVENOUS

## 2013-07-04 MED ORDER — CIPROFLOXACIN IN D5W 400 MG/200ML IV SOLN
400.0000 mg | Freq: Two times a day (BID) | INTRAVENOUS | Status: DC
  Administered 2013-07-04: 400 mg via INTRAVENOUS
  Filled 2013-07-04 (×3): qty 200

## 2013-07-04 MED ORDER — IBUPROFEN 600 MG PO TABS
600.0000 mg | ORAL_TABLET | Freq: Four times a day (QID) | ORAL | Status: DC | PRN
  Filled 2013-07-04: qty 1

## 2013-07-04 MED ORDER — HYDROMORPHONE HCL PF 1 MG/ML IJ SOLN
0.2500 mg | INTRAMUSCULAR | Status: DC | PRN
  Administered 2013-07-04: 0.5 mg via INTRAVENOUS

## 2013-07-04 MED ORDER — CEFAZOLIN SODIUM-DEXTROSE 2-3 GM-% IV SOLR
INTRAVENOUS | Status: DC | PRN
  Administered 2013-07-04: 2 g via INTRAVENOUS

## 2013-07-04 MED ORDER — BUPIVACAINE-EPINEPHRINE 0.25% -1:200000 IJ SOLN
INTRAMUSCULAR | Status: DC | PRN
  Administered 2013-07-04: 32 mL

## 2013-07-04 MED ORDER — NEOSTIGMINE METHYLSULFATE 10 MG/10ML IV SOLN
INTRAVENOUS | Status: DC | PRN
  Administered 2013-07-04: 3 mg via INTRAVENOUS

## 2013-07-04 MED ORDER — FENTANYL CITRATE 0.05 MG/ML IJ SOLN
INTRAMUSCULAR | Status: DC | PRN
  Administered 2013-07-04 (×2): 50 ug via INTRAVENOUS
  Administered 2013-07-04: 100 ug via INTRAVENOUS

## 2013-07-04 MED ORDER — LACTATED RINGERS IR SOLN
Status: DC | PRN
  Administered 2013-07-04 (×2): 1000 mL

## 2013-07-04 MED ORDER — GLYCOPYRROLATE 0.2 MG/ML IJ SOLN
INTRAMUSCULAR | Status: DC | PRN
  Administered 2013-07-04: .4 mg via INTRAVENOUS

## 2013-07-04 MED ORDER — PROMETHAZINE HCL 25 MG/ML IJ SOLN
INTRAMUSCULAR | Status: AC
Start: 2013-07-04 — End: 2013-07-05
  Filled 2013-07-04: qty 1

## 2013-07-04 MED ORDER — ONDANSETRON HCL 4 MG/2ML IJ SOLN
INTRAMUSCULAR | Status: AC
  Filled 2013-07-04: qty 2

## 2013-07-04 MED ORDER — GLYCOPYRROLATE 0.2 MG/ML IJ SOLN
INTRAMUSCULAR | Status: AC
  Filled 2013-07-04: qty 3

## 2013-07-04 MED ORDER — EPHEDRINE SULFATE 50 MG/ML IJ SOLN
INTRAMUSCULAR | Status: DC | PRN
  Administered 2013-07-04: 10 mg via INTRAVENOUS

## 2013-07-04 MED ORDER — SODIUM CHLORIDE 0.9 % IV SOLN
INTRAVENOUS | Status: DC
  Administered 2013-07-04: 19:00:00 via INTRAVENOUS

## 2013-07-04 MED ORDER — ENOXAPARIN SODIUM 40 MG/0.4ML ~~LOC~~ SOLN
40.0000 mg | SUBCUTANEOUS | Status: DC
  Administered 2013-07-05: 40 mg via SUBCUTANEOUS
  Filled 2013-07-04 (×2): qty 0.4

## 2013-07-04 MED ORDER — ROCURONIUM BROMIDE 100 MG/10ML IV SOLN
INTRAVENOUS | Status: DC | PRN
  Administered 2013-07-04 (×2): 5 mg via INTRAVENOUS
  Administered 2013-07-04: 30 mg via INTRAVENOUS

## 2013-07-04 MED ORDER — SODIUM CHLORIDE 0.9 % IJ SOLN
INTRAMUSCULAR | Status: AC
  Filled 2013-07-04: qty 10

## 2013-07-04 MED ORDER — OXYCODONE-ACETAMINOPHEN 5-325 MG PO TABS
1.0000 | ORAL_TABLET | ORAL | Status: DC | PRN
  Administered 2013-07-04 – 2013-07-05 (×3): 2 via ORAL
  Filled 2013-07-04 (×3): qty 2

## 2013-07-04 MED ORDER — KETOROLAC TROMETHAMINE 30 MG/ML IJ SOLN: 15.0000 mg | Freq: Once | INTRAMUSCULAR | Status: DC | PRN

## 2013-07-04 MED ORDER — DEXAMETHASONE SODIUM PHOSPHATE 10 MG/ML IJ SOLN
INTRAMUSCULAR | Status: DC | PRN
  Administered 2013-07-04: 10 mg via INTRAVENOUS

## 2013-07-04 MED ORDER — MIDAZOLAM HCL 2 MG/2ML IJ SOLN
INTRAMUSCULAR | Status: AC
  Filled 2013-07-04: qty 2

## 2013-07-04 MED ORDER — EPHEDRINE SULFATE 50 MG/ML IJ SOLN
INTRAMUSCULAR | Status: AC
  Filled 2013-07-04: qty 1

## 2013-07-04 MED ORDER — MIDAZOLAM HCL 5 MG/5ML IJ SOLN
INTRAMUSCULAR | Status: DC | PRN
  Administered 2013-07-04: 2 mg via INTRAVENOUS

## 2013-07-04 MED ORDER — ONDANSETRON HCL 4 MG/2ML IJ SOLN
4.0000 mg | Freq: Four times a day (QID) | INTRAMUSCULAR | Status: DC | PRN
  Administered 2013-07-04: 4 mg via INTRAVENOUS
  Filled 2013-07-04: qty 2

## 2013-07-04 MED ORDER — PROPOFOL 10 MG/ML IV BOLUS
INTRAVENOUS | Status: AC
  Filled 2013-07-04: qty 20

## 2013-07-04 MED ORDER — ONDANSETRON HCL 4 MG/2ML IJ SOLN
INTRAMUSCULAR | Status: DC | PRN
Start: 2013-07-04 — End: 2013-07-04
  Administered 2013-07-04: 4 mg via INTRAVENOUS

## 2013-07-04 MED ORDER — CEFAZOLIN SODIUM-DEXTROSE 2-3 GM-% IV SOLR
INTRAVENOUS | Status: AC
  Filled 2013-07-04: qty 50

## 2013-07-04 MED ORDER — SODIUM CHLORIDE 0.9 % IJ SOLN
INTRAMUSCULAR | Status: AC
  Filled 2013-07-04: qty 3

## 2013-07-04 MED ORDER — SUCCINYLCHOLINE CHLORIDE 20 MG/ML IJ SOLN
INTRAMUSCULAR | Status: DC | PRN
  Administered 2013-07-04: 100 mg via INTRAVENOUS

## 2013-07-04 MED ORDER — FENTANYL CITRATE 0.05 MG/ML IJ SOLN
INTRAMUSCULAR | Status: AC
  Filled 2013-07-04: qty 5

## 2013-07-04 MED ORDER — PROMETHAZINE HCL 25 MG/ML IJ SOLN
6.2500 mg | INTRAMUSCULAR | Status: DC | PRN
  Administered 2013-07-04: 6.25 mg via INTRAVENOUS

## 2013-07-04 MED ORDER — ONDANSETRON HCL 4 MG PO TABS
4.0000 mg | ORAL_TABLET | Freq: Four times a day (QID) | ORAL | Status: DC | PRN
  Administered 2013-07-05: 4 mg via ORAL
  Filled 2013-07-04: qty 1

## 2013-07-04 MED ORDER — MORPHINE SULFATE 2 MG/ML IJ SOLN
2.0000 mg | INTRAMUSCULAR | Status: DC | PRN
  Administered 2013-07-04: 2 mg via INTRAVENOUS
  Filled 2013-07-04: qty 1

## 2013-07-04 MED ORDER — BUPIVACAINE-EPINEPHRINE 0.25% -1:200000 IJ SOLN
INTRAMUSCULAR | Status: AC
  Filled 2013-07-04: qty 1

## 2013-07-04 MED ORDER — PROPOFOL 10 MG/ML IV BOLUS
INTRAVENOUS | Status: DC | PRN
  Administered 2013-07-04: 150 mg via INTRAVENOUS

## 2013-07-04 MED ORDER — POTASSIUM CHLORIDE IN NACL 40-0.9 MEQ/L-% IV SOLN
INTRAVENOUS | Status: DC
  Filled 2013-07-04 (×2): qty 1000

## 2013-07-04 MED ORDER — IOHEXOL 300 MG/ML  SOLN
INTRAMUSCULAR | Status: DC | PRN
  Administered 2013-07-04: 15:00:00

## 2013-07-04 MED ORDER — HYDROMORPHONE HCL PF 1 MG/ML IJ SOLN
INTRAMUSCULAR | Status: AC
  Filled 2013-07-04: qty 1

## 2013-07-04 SURGICAL SUPPLY — 39 items
APPLIER CLIP 5 13 M/L LIGAMAX5 (MISCELLANEOUS) ×6
CABLE HIGH FREQUENCY MONO STRZ (ELECTRODE) ×3 IMPLANT
CANISTER SUCTION 2500CC (MISCELLANEOUS) IMPLANT
CHLORAPREP W/TINT 26ML (MISCELLANEOUS) ×3 IMPLANT
CLIP APPLIE 5 13 M/L LIGAMAX5 (MISCELLANEOUS) ×2 IMPLANT
COVER MAYO STAND STRL (DRAPES) ×3 IMPLANT
DERMABOND ADVANCED (GAUZE/BANDAGES/DRESSINGS) ×2
DERMABOND ADVANCED .7 DNX12 (GAUZE/BANDAGES/DRESSINGS) ×1 IMPLANT
DRAPE C-ARM 42X120 X-RAY (DRAPES) ×3 IMPLANT
DRAPE LAPAROSCOPIC ABDOMINAL (DRAPES) ×3 IMPLANT
ELECT REM PT RETURN 9FT ADLT (ELECTROSURGICAL) ×3
ELECTRODE REM PT RTRN 9FT ADLT (ELECTROSURGICAL) ×1 IMPLANT
GLOVE BIO SURGEON STRL SZ 6.5 (GLOVE) ×2 IMPLANT
GLOVE BIO SURGEONS STRL SZ 6.5 (GLOVE) ×1
GLOVE BIOGEL PI IND STRL 7.0 (GLOVE) ×1 IMPLANT
GLOVE BIOGEL PI INDICATOR 7.0 (GLOVE) ×2
GOWN SPEC L3 XXLG W/TWL (GOWN DISPOSABLE) ×3 IMPLANT
GOWN STRL REUS W/TWL XL LVL3 (GOWN DISPOSABLE) ×6 IMPLANT
HEMOSTAT SURGICEL 4X8 (HEMOSTASIS) ×3 IMPLANT
IV LACTATED RINGERS 1000ML (IV SOLUTION) ×6 IMPLANT
KIT BASIN OR (CUSTOM PROCEDURE TRAY) ×3 IMPLANT
MANIFOLD NEPTUNE II (INSTRUMENTS) ×3 IMPLANT
PENCIL BUTTON HOLSTER BLD 10FT (ELECTRODE) ×3 IMPLANT
POUCH SPECIMEN RETRIEVAL 10MM (ENDOMECHANICALS) ×3 IMPLANT
SCISSORS LAP 5X35 DISP (ENDOMECHANICALS) ×3 IMPLANT
SET CHOLANGIOGRAPH MIX (MISCELLANEOUS) ×3 IMPLANT
SET IRRIG TUBING LAPAROSCOPIC (IRRIGATION / IRRIGATOR) ×3 IMPLANT
SOLUTION ANTI FOG 6CC (MISCELLANEOUS) ×3 IMPLANT
SUT VIC AB 2-0 SH 27 (SUTURE) ×2
SUT VIC AB 2-0 SH 27X BRD (SUTURE) ×1 IMPLANT
SUT VIC AB 4-0 PS2 18 (SUTURE) ×3 IMPLANT
SUT VIC AB 4-0 PS2 27 (SUTURE) ×3 IMPLANT
TOWEL OR 17X26 10 PK STRL BLUE (TOWEL DISPOSABLE) ×3 IMPLANT
TOWEL OR NON WOVEN STRL DISP B (DISPOSABLE) ×3 IMPLANT
TRAY LAP CHOLE (CUSTOM PROCEDURE TRAY) ×3 IMPLANT
TROCAR BLADELESS OPT 5 75 (ENDOMECHANICALS) ×3 IMPLANT
TROCAR SLEEVE XCEL 5X75 (ENDOMECHANICALS) ×6 IMPLANT
TROCAR XCEL BLUNT TIP 100MML (ENDOMECHANICALS) ×3 IMPLANT
TUBING INSUFFLATION 10FT LAP (TUBING) ×3 IMPLANT

## 2013-07-04 NOTE — Progress Notes (Signed)
ATTENDING ADDENDUM:  I personally reviewed patient's record, examined the patient, and formulated the following assessment and plan:  The patient appears to have classic acute on chronic cholecystitis.  HIDA positive.  US shows gallstones. AST/ALT elevated.  Bilirubin and lipase normal.  The anatomy & physiology of hepatobiliary & pancreatic function was discussed.  The pathophysiology of gallbladder dysfunction was discussed.  Natural history risks without surgery was discussed.   I feel the risks of no intervention will lead to serious problems that outweigh the operative risks; therefore, I recommended cholecystectomy to remove the pathology.  I explained laparoscopic techniques with possible need for an open approach.  Probable cholangiogram to evaluate the bilary tract was explained as well.    Risks such as bleeding, infection, abscess, leak, injury to other organs, need for further treatment, heart attack, death, and other risks were discussed.  I noted a good likelihood this will help address the problem.  Possibility that this will not correct all abdominal symptoms was explained.  Goals of post-operative recovery were discussed as well.  We will work to minimize complications.  An educational handout further explaining the pathology and treatment options was given as well.  Questions were answered.  The patient expresses understanding & wishes to proceed with surgery.

## 2013-07-04 NOTE — Anesthesia Preprocedure Evaluation (Signed)
Anesthesia Evaluation  Patient identified by MRN, date of birth, ID band Patient awake    Reviewed: Allergy & Precautions, H&P , NPO status , Patient's Chart, lab work & pertinent test results  Airway Mallampati: II TM Distance: >3 FB Neck ROM: Full    Dental no notable dental hx.    Pulmonary Current Smoker,  breath sounds clear to auscultation  Pulmonary exam normal       Cardiovascular negative cardio ROS  Rhythm:Regular Rate:Normal     Neuro/Psych negative neurological ROS  negative psych ROS   GI/Hepatic negative GI ROS, Neg liver ROS,   Endo/Other  negative endocrine ROS  Renal/GU negative Renal ROS  negative genitourinary   Musculoskeletal negative musculoskeletal ROS (+)   Abdominal   Peds negative pediatric ROS (+)  Hematology  (+) anemia ,   Anesthesia Other Findings   Reproductive/Obstetrics negative OB ROS                           Anesthesia Physical Anesthesia Plan  ASA: II  Anesthesia Plan: General   Post-op Pain Management:    Induction: Intravenous  Airway Management Planned: Oral ETT  Additional Equipment:   Intra-op Plan:   Post-operative Plan: Extubation in OR  Informed Consent: I have reviewed the patients History and Physical, chart, labs and discussed the procedure including the risks, benefits and alternatives for the proposed anesthesia with the patient or authorized representative who has indicated his/her understanding and acceptance.   Dental advisory given  Plan Discussed with: CRNA and Surgeon  Anesthesia Plan Comments:         Anesthesia Quick Evaluation

## 2013-07-04 NOTE — Transfer of Care (Signed)
Immediate Anesthesia Transfer of Care Note  Patient: Micheline ChapmanMarivel Donate  Procedure(s) Performed: Procedure(s): LAPAROSCOPIC CHOLECYSTECTOMY WITH INTRAOPERATIVE CHOLANGIOGRAM (N/A)  Patient Location: PACU  Anesthesia Type:General  Level of Consciousness: awake and oriented  Airway & Oxygen Therapy: Patient Spontanous Breathing and Patient connected to face mask oxygen  Post-op Assessment: Report given to PACU RN and Post -op Vital signs reviewed and stable  Post vital signs: Reviewed and stable  Complications: No apparent anesthesia complications

## 2013-07-04 NOTE — Progress Notes (Signed)
Tried x2 to place new IV as old IV was painful at site. Another floor nurse attempted to place and was unsuccessful. IV team paged awaiting for placement.   Called OR to notify of status of IV, and they will call back upon bringing her down for surgery. If one is not placed by this time, they will accommodate patient placement of peripheral line.   Pt is otherwise stable.

## 2013-07-04 NOTE — Op Note (Signed)
07/03/2013 - 07/04/2013  4:18 PM  PATIENT:  Anna Hill  28 y.o. female  Patient Care Team: Caroline MoreElizabeth Prince, PA-C as PCP - General (Internal Medicine)  PRE-OPERATIVE DIAGNOSIS:  cholecystitis  POST-OPERATIVE DIAGNOSIS:  cholecystitis  PROCEDURE:  LAPAROSCOPIC CHOLECYSTECTOMY WITH INTRAOPERATIVE CHOLANGIOGRAM  SURGEON:  Surgeon(s): Romie LeveeAlicia Reynol Arnone, MD  ASSISTANT: none   ANESTHESIA:   local and general  EBL: 200ml Total I/O In: 1000 [I.V.:1000] Out: 603 [Urine:400; Emesis/NG output:3; Blood:200]  DRAINS: none   SPECIMEN:  Source of Specimen:  gallbladder  DISPOSITION OF SPECIMEN:  PATHOLOGY  COUNTS:  YES  PLAN OF CARE: Patient already admitted  PATIENT DISPOSITION:  PACU - hemodynamically stable.  INDICATION: This is a 28 y.o. F with acute on chronic cholecystitis.    The anatomy & physiology of hepatobiliary & pancreatic function was discussed.  The pathophysiology of gallbladder dysfunction was discussed.  Natural history risks without surgery was discussed.   I feel the risks of no intervention will lead to serious problems that outweigh the operative risks; therefore, I recommended cholecystectomy to remove the pathology.  I explained laparoscopic techniques with possible need for an open approach.  Probable cholangiogram to evaluate the bilary tract was explained as well.    Risks such as bleeding, infection, abscess, leak, injury to other organs, need for further treatment, heart attack, death, and other risks were discussed.  I noted a good likelihood this will help address the problem.  Possibility that this will not correct all abdominal symptoms was explained.  Goals of post-operative recovery were discussed as well.    OR FINDINGS: Chronically inflamed, elongated gallbladder, normal appearing IOC  DESCRIPTION:   The patient was identified & brought into the operating room. The patient was positioned supine with arms tucked. SCDs were active during the entire  case. The patient underwent general anesthesia without any difficulty.  The abdomen was prepped and draped in a sterile fashion. A Surgical Timeout was performed and confirmed our plan.  We positioned the patient in reverse [place] & right side up.  I placed a Hassan laparoscopic port through the umbilicus using open entry technique.  Entry was clean. There were no adhesions to the anterior abdominal wall supraumbilically.  We induced carbon dioxide insufflation. Camera inspection revealed no injury.    I proceeded to continue with laparoscopic technique. I placed a 5 mm port in mid subcostal region, another 5mm port in the right flank near the anterior axillary line, and a 5mm port in the left subxiphoid region obliquely within the falciform ligament.  I turned attention to the right upper quadrant.    The gallbladder fundus was elevated cephalad. I used cautery and blunt dissection to free the peritoneal coverings between the gallbladder and the liver on the posteriolateral and anteriomedial walls.   I used careful blunt and cautery dissection with a maryland dissector to help get a good critical view of the cystic artery and cystic duct. I did further dissection to free a few centimeters of the  gallbladder off the liver bed to get a good critical view of the infundibulum and cystic duct. I mobilized the cystic artery.  I skeletonized the cystic duct.  After getting a good 360 view, I decided to perform a cholangiogram.  I placed a clip on the infundibulum.   I did a partial cystic duct-otomy and ensured patency. I placed a 5 F cholangiocatheter through a puncture site at the right subcostal ridge of the abdominal wall and directed it into the cystic  duct.  This was secured with a clip. We ran a cholangiogram with dilute radio-opaque contrast and continuous fluoroscopy.  Contrast flowed from a side branch consistent with cystic duct cannulization. Contrast flowed up the common hepatic duct into the  right and left intrahepatic chains out to secondary radicals. Contrast flowed down the common bile duct easily across the normal ampulla into the duodenum.  This was consistent with a normal cholangiogram.  I removed the cholangiocatheter.  I placed clips on the cystic duct x3.  I completed cystic duct transection.   I placed clips on the cystic artery x3 with 2 proximally.  I ligated the cystic artery using scissors. I freed the gallbladder from its remaining attachments to the liver. I ensured hemostasis on the gallbladder fossa of the liver and elsewhere. I inspected the rest of the abdomen & detected no injury nor bleeding elsewhere.  I irrigated the RUQ with normal saline.  I removed the gallbladder through the umbilical port site.  I closed the umbilical fascia using 0 Vicryl stitches.   I closed the skin using 4-0 vicryl stitch.  Sterile dressings were applied. The patient was extubated & arrived in the PACU in stable condition.  I had discussed postoperative care with the patient in the holding area.   I will discuss  operative findings and postoperative goals / instructions with the patient.  Instructions are written in the chart as well.

## 2013-07-04 NOTE — Discharge Instructions (Addendum)
LAPAROSCOPIC SURGERY: POST OP INSTRUCTIONS ° °1. DIET: Follow a light bland diet the first 24 hours after arrival home, such as soup, liquids, crackers, etc.  Be sure to include lots of fluids daily.  Avoid fast food or heavy meals as your are more likely to get nauseated.  Eat a low fat the next few days after surgery.   °2. Take your usually prescribed home medications unless otherwise directed. °3. PAIN CONTROL: °a. Pain is best controlled by a usual combination of three different methods TOGETHER: °i. Ice/Heat °ii. Over the counter pain medication °iii. Prescription pain medication °b. Most patients will experience some swelling and bruising around the incisions.  Ice packs or heating pads (30-60 minutes up to 6 times a day) will help. Use ice for the first few days to help decrease swelling and bruising, then switch to heat to help relax tight/sore spots and speed recovery.  Some people prefer to use ice alone, heat alone, alternating between ice & heat.  Experiment to what works for you.  Swelling and bruising can take several weeks to resolve.   °c. It is helpful to take an over-the-counter pain medication regularly for the first few weeks.  Choose one of the following that works best for you: °i. Naproxen (Aleve, etc)  Two 220mg tabs twice a day °ii. Ibuprofen (Advil, etc) Three 200mg tabs four times a day (every meal & bedtime) °d. A  prescription for pain medication (such as percocet, vicodin, oxycodone, hydrocodone, etc) should be given to you upon discharge.  Take your pain medication as prescribed.  °i. If you are having problems/concerns with the prescription medicine (does not control pain, nausea, vomiting, rash, itching, etc), please call us [phone] to see if we need to switch you to a different pain medicine that will work better for you and/or control your side effect better. °ii. If you need a refill on your pain medication, please contact your pharmacy.  They will contact our office to  request authorization. Prescriptions will not be filled after 5 pm or on week-ends. ° ° °4. Avoid getting constipated.  Between the surgery and the pain medications, it is common to experience some constipation.  Increasing fluid intake and taking a fiber supplement (such as Metamucil, Citrucel, FiberCon, MiraLax, etc) 1-2 times a day regularly will usually help prevent this problem from occurring.  A mild laxative (prune juice, Milk of Magnesia, MiraLax, etc) should be taken according to package directions if there are no bowel movements after 48 hours.   °5. Watch out for diarrhea.  If you have many loose bowel movements, simplify your diet to bland foods & liquids for a few days.  Stop any stool softeners and decrease your fiber supplement.  Switching to mild anti-diarrheal medications (Kayopectate, Pepto Bismol) can help.  If this worsens or does not improve, please call us. °6. Wash / shower every day.  You may shower over the dressings as they are waterproof.  Continue to shower over incision(s) after the dressing is off. °7. Remove your waterproof bandages 5 days after surgery.  You may leave the incision open to air.  You may replace a dressing/Band-Aid to cover the incision for comfort if you wish.  °8. ACTIVITIES as tolerated:   °a. You may resume regular (light) daily activities beginning the next day--such as daily self-care, walking, climbing stairs--gradually increasing activities as tolerated.  If you can walk 30 minutes without difficulty, it is safe to try more intense activity such as   jogging, treadmill, bicycling, low-impact aerobics, swimming, etc. b. Save the most intensive and strenuous activity for last such as sit-ups, heavy lifting, contact sports, etc  Refrain from any heavy lifting or straining until you are off narcotics for pain control.   c. DO NOT PUSH THROUGH PAIN.  Let pain be your guide: If it hurts to do something, don't do it.  Pain is your body warning you to avoid that  activity for another week until the pain goes down. d. You may drive when you are no longer taking prescription pain medication, you can comfortably wear a seatbelt, and you can safely maneuver your car and apply brakes. e. Bonita QuinYou may have sexual intercourse when it is comfortable.  9. FOLLOW UP in our office a. Please call [facility] at [phone] to set up an appointment to see your surgeon in the office for a follow-up appointment approximately 2-3 weeks after your surgery. b. Make sure that you call for this appointment the day you arrive home to insure a convenient appointment time. 10. IF YOU HAVE DISABILITY OR FAMILY LEAVE FORMS, BRING THEM TO THE OFFICE FOR PROCESSING.  DO NOT GIVE THEM TO YOUR DOCTOR.   WHEN TO CALL US [phone]: 1. Poor pain control 2. Reactions / problems with new medications (rash/itching, nausea, etc)  3. Fever over 101.5 F (38.5 C) 4. Inability to urinate 5. Nausea and/or vomiting 6. Worsening swelling or bruising 7. Continued bleeding from incision. 8. Increased pain, redness, or drainage from the incision   The clinic staff is available to answer your questions during regular business hours (8:30am-5pm).  Please dont hesitate to call and ask to speak to one of our nurses for clinical concerns.   If you have a medical emergency, go to the nearest emergency room or call 911.  [facility] always [facility] at the [facility]   [facility], [place] [address], [address], [place], [place]  [id number] ? MAIN: [phone] ? TOLL FREE: [phone] ?  FAX [phone] [email or web address]  Colecistectoma laparoscpica - Cuidados posteriores (Laparoscopic Cholecystectomy, Care After) Siga estas instrucciones durante las prximas semanas. Estas indicaciones le proporcionan informacin general acerca de cmo deber cuidarse despus del procedimiento. El mdico tambin podr darle instrucciones ms  especficas. El tratamiento ha sido planificado segn las prcticas mdicas actuales, pero en algunos casos pueden ocurrir problemas. Comunquese con el mdico si tiene algn problema o tiene dudas despus del procedimiento. QU ESPERAR DESPUS DEL PROCEDIMIENTO Despus del procedimiento, es comn tener las siguientes sensaciones:  Dolor en los lugares de la incisin. Le darn analgsicos para Human resources officercontrolar el dolor.  Nuseas o vmitos leves. Estos sntomas deberan mejorar despus de las primeras 24horas.  Meteorismo y Designer, fashion/clothingposiblemente dolor en el hombro debido al gas que se [place] durante el procedimiento. Estos sntomas mejorarn despus de las primeras 24horas. INSTRUCCIONES PARA EL CUIDADO EN EL HOGAR   Cambie los apsitos (vendajes) tal como le indic el mdico.  Mantenga la herida limpia y seca. Puede lavar la herida suavemente con agua y [place]. Seque dando palmaditas suaves.  No se bae en la baera, no practique natacin ni use el jacuzzy durante 2semanas o hasta que lo autorice el mdico.  Chisholmome solo medicamentos de venta libre o recetados, segn las indicaciones del mdico.  Siga su dieta normal segn las indicaciones de su mdico.  No levante ningn objeto que pese ms de 10libras (4,5kg) hasta que el mdico lo autorice.  No  practique deportes de contacto durante 1semana o hasta que el mdico lo autorice. SOLICITE ATENCIN MDICA SI:   Presenta enrojecimiento, hinchazn o aumento del dolor en la herida.  Observa una secrecin de color blanco amarillento (pus) en la herida.  Hay una secrecin en la herida que dura ms de 1da.  Advierte un olor ftido que proviene de la herida o del vendaje.  Los cortes quirrgicos (incisiones) se abren. SOLICITE ATENCIN MDICA DE INMEDIATO SI:   Le aparece una erupcin cutnea.  Tiene dificultad para respirar.  Siente dolor en el pecho.  Tiene fiebre.  Nota un incremento del dolor en los hombros (en la zona donde van los  breteles).  Presenta episodios de mareos o se siente dbil cuando est de pie.  Siente un dolor abdominal intenso.  Tiene Programme researcher, broadcasting/film/videomalestar estomacal (nuseas) o vomita y esto dura ms de 1da. Document Released: 09/13/2010 Document Revised: 11/21/2012 [facility] Patient Information 2014 [place], [place].

## 2013-07-04 NOTE — Progress Notes (Signed)
  Subjective: In bed dry heaves said she threw up some green stuff earlier in [place].  Tearful and in pain Mid upper abdomen and RUQ.    Objective: Vital signs in last 24 hours: Temp:  [98 F (36.7 C)-99.1 F (37.3 C)] 98 F (36.7 C) (05/21 0547) Pulse Rate:  [61-94] 61 (05/21 0547) Resp:  [16-20] 16 (05/21 0547) BP: (95-123)/(61-88) 95/61 mmHg (05/21 0547) SpO2:  [93 %-99 %] 93 % (05/21 0547) Weight:  [60.782 kg (134 lb)] 60.782 kg (134 lb) (05/21 0440) Last BM Date: 07/03/13 NPO K+ 3.6 LFT's up Intake/Output from previous day: 05/20 0701 - 05/21 0700 In: 550 [I.V.:550] Out: 750 [Urine:750] Intake/Output this shift:    General appearance: alert, cooperative and no distress Resp: clear to auscultation bilaterally GI: soft, pain mid and RUQ.  +BS, no distension  Lab Results:   Recent Labs  07/03/13 1918 07/04/13 0515  WBC 9.7 8.9  HGB 12.1 10.6*  HCT 35.7* 32.5*  PLT 260 217    BMET  Recent Labs  07/03/13 1918 07/04/13 0515  NA 136* 139  K 3.4* 3.6*  CL 100 104  CO2 20 24  GLUCOSE 85 100*  BUN 10 6  CREATININE 0.57 0.54  CALCIUM 9.2 8.8   PT/INR No results found for this basename: LABPROT, INR,  in the last 72 hours   Recent Labs Lab 07/03/13 1918 07/04/13 0515  AST 19 259*  ALT 20 184*  ALKPHOS 94 95  BILITOT 0.3 0.7  PROT 8.2 7.3  ALBUMIN 4.3 3.8     Lipase     Component Value Date/Time   LIPASE 18 07/03/2013 1918     Studies/Results: No results found.  Medications: . heparin  5,000 Units Subcutaneous 3 times per day  . pantoprazole (PROTONIX) IV  40 mg Intravenous QHS    Assessment/Plan Chronic cholecystitis with ongoing RUQ abdominal pain, nausea and vomiting. Positive  HIDA. Hx of anal fissure and Internal hemorrhoids. Tobacco use  Hypokalemia   PLan:  She is posted for surgery later today.  I am going to start Cipro, give her more phenergan, continue dilaudid.   Recheck labs in AM.  Replace K+ thru IV    LOS: 1 day     Sherrie GeorgeWillard Jaikob Borgwardt 07/04/2013

## 2013-07-04 NOTE — Anesthesia Postprocedure Evaluation (Signed)
  Anesthesia Post-op Note  Patient: [name]  Procedure(s) Performed: Procedure(s) (LRB): LAPAROSCOPIC CHOLECYSTECTOMY WITH INTRAOPERATIVE CHOLANGIOGRAM (N/A)  Patient Location: PACU  Anesthesia Type: General  Level of Consciousness: awake and alert   Airway and Oxygen Therapy: Patient Spontanous Breathing  Post-op Pain: mild  Post-op Assessment: Post-op Vital signs reviewed, Patient's Cardiovascular Status Stable, Respiratory Function Stable, Patent Airway and No signs of Nausea or vomiting  Last Vitals:  Filed Vitals:   07/04/13 1700  BP: 116/80  Pulse: 74  Temp:   Resp: 17    Post-op Vital Signs: stable   Complications: No apparent anesthesia complications

## 2013-07-05 ENCOUNTER — Encounter (HOSPITAL_COMMUNITY)
Admission: EM | Disposition: A | Discharge status: Home / Self Care | Payer: Self-pay | Source: Home / Self Care | Attending: Emergency Medicine

## 2013-07-05 ENCOUNTER — Encounter (HOSPITAL_COMMUNITY): Payer: Self-pay | Admitting: General Surgery

## 2013-07-05 SURGERY — MUSCLE BIOPSY
Anesthesia: Choice

## 2013-07-05 MED ORDER — IBUPROFEN 200 MG PO TABS: ORAL_TABLET | ORAL | Status: DC

## 2013-07-05 MED ORDER — OXYCODONE-ACETAMINOPHEN 5-325 MG PO TABS: 1.0000 | ORAL_TABLET | ORAL | Status: DC | PRN

## 2013-07-05 NOTE — Progress Notes (Signed)
ATTENDING ADDENDUM:  I personally reviewed patient's record, examined the patient, and formulated the following assessment and plan:  Doing well.  Having some post op pain but previous RUQ and back pain has resolved.  D/C home this afternoon if still doing well.

## 2013-07-05 NOTE — Progress Notes (Signed)
1 Day Post-Op  Subjective: Much happier, she has to go to BR so she didn't want to talk to much.  Sites look good, doing well with PO pain medicine.  I will let her go home if she does well after lunch.  Objective: Vital signs in last 24 hours: Temp:  [98 F (36.7 C)-99.2 F (37.3 C)] 98.4 F (36.9 C) (05/22 0518) Pulse Rate:  [52-77] 52 (05/22 0518) Resp:  [6-17] 16 (05/22 0518) BP: (90-136)/(47-90) 90/50 mmHg (05/22 0518) SpO2:  [96 %-100 %] 97 % (05/22 0518) Last BM Date: 07/03/13 (PTA) Diet:  Regular Afebrile, VSS, BP down some NO labs Intake/Output from previous day: 05/21 0701 - 05/22 0700 In: 2527.5 [I.V.:2527.5] Out: 3703 [Urine:3500; Emesis/NG output:3; Blood:200] Intake/Output this shift:    General appearance: alert, cooperative and no distress GI: sore port sites look fine.  Feeling much better.  Lab Results:   Recent Labs  07/03/13 1918 07/04/13 0515  WBC 9.7 8.9  HGB 12.1 10.6*  HCT 35.7* 32.5*  PLT 260 217    BMET  Recent Labs  07/03/13 1918 07/04/13 0515  NA 136* 139  K 3.4* 3.6*  CL 100 104  CO2 20 24  GLUCOSE 85 100*  BUN 10 6  CREATININE 0.57 0.54  CALCIUM 9.2 8.8   PT/INR No results found for this basename: LABPROT, INR,  in the last 72 hours   Recent Labs Lab 07/03/13 1918 07/04/13 0515  AST 19 259*  ALT 20 184*  ALKPHOS 94 95  BILITOT 0.3 0.7  PROT 8.2 7.3  ALBUMIN 4.3 3.8     Lipase     Component Value Date/Time   LIPASE 18 07/03/2013 1918     Studies/Results: Dg Cholangiogram Operative  07/04/2013   CLINICAL DATA:  Cholelithiasis  EXAM: INTRAOPERATIVE CHOLANGIOGRAM  TECHNIQUE: Cholangiographic images from the C-arm fluoroscopic device were submitted for interpretation post-operatively. Please see the procedural report for the amount of contrast and the fluoroscopy time utilized.  COMPARISON:  None.  FINDINGS: Cystic duct is injected with contrast. Common bile duct is patent to the duodenum. No bile duct obstruction.  No filling defect or leak is identified.  IMPRESSION: Negative operative cholangiogram.   Electronically Signed   By: Marlan Palau M.D.   On: 07/04/2013 17:00    Medications: . enoxaparin (LOVENOX) injection  40 mg Subcutaneous Q24H    Assessment/Plan Chronic cholecystitis with ongoing RUQ abdominal pain, nausea and vomiting. Positive HIDA.  S/p LAPAROSCOPIC CHOLECYSTECTOMY WITH INTRAOPERATIVE CHOLANGIOGRAM,  Romie Levee, MD, 07/04/13. Hx of anal fissure and Internal hemorrhoids.  Tobacco use  Hypokalemia   Plan:  Home after she walks and lunch.   LOS: 2 days    Sherrie George 07/05/2013

## 2013-07-11 NOTE — Discharge Summary (Signed)
Physician Discharge Summary  Patient ID: Anna ChapmanMarivel Schuld MRN: [id number] DOB/AGE: 1985-03-18 28 y.o.  Admit date: 07/03/2013 Discharge date: 07/11/2013  Admission Diagnoses:  Cholecystitis Hx of anemia Hemorrhoids Anal fissure Tobacco use  Hypokalemia   Discharge Diagnoses:  Cholecystitis Hx of anemia Hemorrhoids Anal fissure Tobacco use Hypokalemia    Active Problems:   Cholecystitis   PROCEDURES: LAPAROSCOPIC CHOLECYSTECTOMY WITH INTRAOPERATIVE CHOLANGIOGRAM, Romie LeveeAlicia Thomas, MD, 07/04/2013.       Hospital Course:  28 year old female referred by Dr. Arlyce DiceKaplan for evaluation of abdominal pain. She states that she's been having abdominal pain for about a year. She describes it as starting in her upper midline and radiating underneath her right rib cage. He'll also get her back and shoulder. It occurs on a daily basis. It will occur at random times throughout the day. Generally more pronounced in the mornings. Some days are worse than others. It lasts for various amounts of time but generally resolves within 1 hour. She does experience postprandial nausea. She does have occasional emesis. She states her weight has been fluctuating she has been afraid to eat because of the discomfort she will get after eating. She is currently being treated for internal hemorrhoids as well as an anal fissure therefore her bowel movements are low but irregular. She denies any acholic stools. She denies any jaundice. She denies any fever or chills. She takes NSAIDs on an occasional basis. She states that she's had a upper endoscopy which was reportedly normal. She's underwent an ultrasound as well as to nuclear medicine scans of her gallbladder. She states she will only get relief if she curls up into a ball.  She states tonight that she has continued vomiting.  She was seen in the ED by Dr. Daphine DeutscherMartin and admitted.  She was hydrated and taken to the OR the following AM.  She did well and post op made very  good progress.  We got her back on a diet and she was ready for discharge the following afternoon.  Her port sites looked fine.  She will follow up in the office.  Condition on d/c:  Improved  CBC Latest Ref Rng 07/04/2013 07/03/2013  WBC 4.0 - 10.5 K/uL 8.9 9.7  Hemoglobin 12.0 - 15.0 g/dL 10.6(L) 12.1  Hematocrit 36.0 - 46.0 % 32.5(L) 35.7(L)  Platelets 150 - 400 K/uL 217 260   CMP     Component Value Date/Time   NA 139 07/04/2013 0515   K 3.6* 07/04/2013 0515   CL 104 07/04/2013 0515   CO2 24 07/04/2013 0515   GLUCOSE 100* 07/04/2013 0515   BUN 6 07/04/2013 0515   CREATININE 0.54 07/04/2013 0515   CALCIUM 8.8 07/04/2013 0515   PROT 7.3 07/04/2013 0515   ALBUMIN 3.8 07/04/2013 0515   AST 259* 07/04/2013 0515   ALT 184* 07/04/2013 0515   ALKPHOS 95 07/04/2013 0515   BILITOT 0.7 07/04/2013 0515   GFRNONAA >90 07/04/2013 0515   GFRAA >90 07/04/2013 0515    Disposition: 01-Home or Self Care     Medication List    STOP taking these medications       AMBULATORY NON FORMULARY MEDICATION      TAKE these medications       CRYSELLE-28 0.3-30 MG-MCG tablet  Generic drug:  norgestrel-ethinyl estradiol     hyoscyamine 0.375 MG 12 hr tablet  Commonly known as:  LEVBID  Take 0.375 mg by mouth See admin instructions. 0.375 mg twice daily for 5 days then as needed for  cramps     ibuprofen 200 MG tablet  Commonly known as:  ADVIL,MOTRIN  You can take 2-3 tablets every 6 hours as needed for pain.     ondansetron 8 MG disintegrating tablet  Commonly known as:  ZOFRAN-ODT  Take 8 mg by mouth every 6 (six) hours as needed for nausea or vomiting.     oxyCODONE-acetaminophen 5-325 MG per tablet  Commonly known as:  PERCOCET/ROXICET  Take 1-2 tablets by mouth every 4 (four) hours as needed for moderate pain.     PRESCRIPTION MEDICATION  Apply 1 application topically as needed (pain). Nitroglycerin gel [facility]           Follow-up Information   Follow up with Vanita Panda., MD.  Schedule an appointment as soon as possible for a visit in 2 weeks.   Specialty:  General Surgery   Contact information:   [address] [place] [place] [address] [phone]       Signed: Sherrie George 07/11/2013, 2:09 PM

## 2013-07-17 ENCOUNTER — Ambulatory Visit: Payer: No Typology Code available for payment source | Admitting: Gastroenterology

## 2013-07-17 ENCOUNTER — Telehealth: Payer: Self-pay | Admitting: Gastroenterology

## 2013-07-17 ENCOUNTER — Telehealth (INDEPENDENT_AMBULATORY_CARE_PROVIDER_SITE_OTHER): Payer: Self-pay

## 2013-07-17 NOTE — Telephone Encounter (Signed)
No charge. 

## 2013-07-17 NOTE — Telephone Encounter (Signed)
Unable to lv VM bc it was full, but sent a page from this number.Please advise pt that she is scheduled for her 1st PO Fri. 07/19/13 @ 2:25pm. Please arrive at 2:05pm. This is the earliest and only appt available for awhile.

## 2013-07-19 ENCOUNTER — Encounter (INDEPENDENT_AMBULATORY_CARE_PROVIDER_SITE_OTHER): Payer: No Typology Code available for payment source | Admitting: General Surgery

## 2013-08-07 ENCOUNTER — Encounter (INDEPENDENT_AMBULATORY_CARE_PROVIDER_SITE_OTHER): Payer: No Typology Code available for payment source | Admitting: General Surgery

## 2013-08-09 ENCOUNTER — Encounter (INDEPENDENT_AMBULATORY_CARE_PROVIDER_SITE_OTHER): Payer: Self-pay | Admitting: General Surgery

## 2013-09-13 ENCOUNTER — Ambulatory Visit: Payer: No Typology Code available for payment source | Admitting: Gastroenterology

## 2014-12-07 IMAGING — NM NM HEPATO W/GB/PHARM/[PERSON_NAME]
1 series · 12 of 12 positions shown · non-contrast
Comparison: CT abdomen and pelvis 01/16/2013

CLINICAL DATA: Abdominal pain for 6 months, history cholelithiasis

EXAM:
NUCLEAR MEDICINE HEPATOBILIARY IMAGING WITH GALLBLADDER EF
TECHNIQUE: Sequential images of the abdomen were obtained [DATE] minutes
following intravenous administration of radiopharmaceutical. After
slow intravenous infusion of 1.2 micrograms Cholecystokinin,
gallbladder ejection fraction was determined.
RADIOPHARMACEUTICALS:  5.5 mCi Cc-QQm Choletec

[Series 1: hepato · 4.46mm/px · 2 acquisitions, 12 frames shown]
[im 1/2]
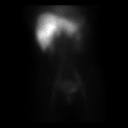
[im 1/2]
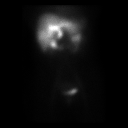
[im 1/2]
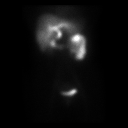
[im 1/2]
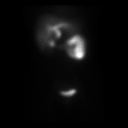
[im 1/2]
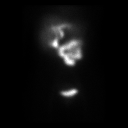
[im 1/2]
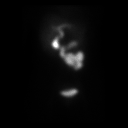
[im 2/2]
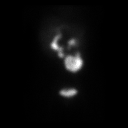
[im 2/2]
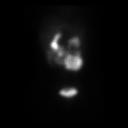
[im 2/2]
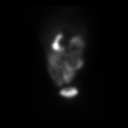
[im 2/2]
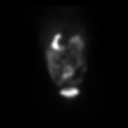
[im 2/2]
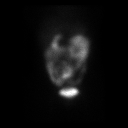
[im 2/2]
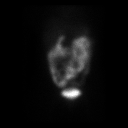

[12 of 12 positions shown; findings below may reference images not displayed]

FINDINGS: Normal tracer extraction from bloodstream indicating normal
hepatocellular function.

Prompt excretion of tracer into biliary tree.

Small bowel is visualized by 15 min.

Gallbladder does not visualize during the first 60 minutes of
imaging.

A curvilinear collection of tracer inferior to the liver was felt
represent a gallbladder and a subsequent CCK exam was performed.

However this appears instead to represent the duodenal C-loop and
the gallbladder is not visualized.

This precludes adequate assessment of the cystic duct to establish
patency, and cannot assess gallbladder function/emptying..

Assessment of the gallbladder would require repeat hepatobiliary
imaging with delayed imaging and potentially morphine augmentation
if necessary to confirm patency of the cystic duct.
IMPRESSION: Normal hepatocellular function.

Patent CBD with passage of tracer into small bowel.

Nonvisualization of the gallbladder at 1 hr ; as this was initially
felt to represent gallbladder, delayed imaging and morphine
augmentation were not performed to establish cystic duct patency.

Consider repeat imaging with delayed imaging and potentially
morphine augmentation to establish patency of the cystic duct ; if
gallbladder visualizes prior to morphine, CCK imaging could then be
repeated.

## 2022-09-15 DIAGNOSIS — Z419 Encounter for procedure for purposes other than remedying health state, unspecified: Secondary | ICD-10-CM | POA: Diagnosis not present

## 2022-09-27 DIAGNOSIS — J383 Other diseases of vocal cords: Secondary | ICD-10-CM | POA: Diagnosis not present

## 2022-09-27 DIAGNOSIS — J309 Allergic rhinitis, unspecified: Secondary | ICD-10-CM | POA: Diagnosis not present

## 2022-09-27 DIAGNOSIS — R202 Paresthesia of skin: Secondary | ICD-10-CM | POA: Diagnosis not present

## 2022-09-27 DIAGNOSIS — R053 Chronic cough: Secondary | ICD-10-CM | POA: Diagnosis not present

## 2022-09-27 DIAGNOSIS — K219 Gastro-esophageal reflux disease without esophagitis: Secondary | ICD-10-CM | POA: Diagnosis not present

## 2022-10-08 DIAGNOSIS — H5711 Ocular pain, right eye: Secondary | ICD-10-CM | POA: Diagnosis not present

## 2022-10-08 DIAGNOSIS — R200 Anesthesia of skin: Secondary | ICD-10-CM | POA: Diagnosis not present

## 2022-10-08 DIAGNOSIS — R202 Paresthesia of skin: Secondary | ICD-10-CM | POA: Diagnosis not present

## 2022-10-16 DIAGNOSIS — Z419 Encounter for procedure for purposes other than remedying health state, unspecified: Secondary | ICD-10-CM | POA: Diagnosis not present

## 2022-11-11 DIAGNOSIS — R5383 Other fatigue: Secondary | ICD-10-CM | POA: Diagnosis not present

## 2022-11-11 DIAGNOSIS — Z6831 Body mass index (BMI) 31.0-31.9, adult: Secondary | ICD-10-CM | POA: Diagnosis not present

## 2022-11-11 DIAGNOSIS — M25511 Pain in right shoulder: Secondary | ICD-10-CM | POA: Diagnosis not present

## 2022-11-11 DIAGNOSIS — R0602 Shortness of breath: Secondary | ICD-10-CM | POA: Diagnosis not present

## 2022-11-11 DIAGNOSIS — D509 Iron deficiency anemia, unspecified: Secondary | ICD-10-CM | POA: Diagnosis not present

## 2022-11-11 DIAGNOSIS — J301 Allergic rhinitis due to pollen: Secondary | ICD-10-CM | POA: Diagnosis not present

## 2022-11-11 DIAGNOSIS — E559 Vitamin D deficiency, unspecified: Secondary | ICD-10-CM | POA: Diagnosis not present

## 2022-11-11 DIAGNOSIS — Z0000 Encounter for general adult medical examination without abnormal findings: Secondary | ICD-10-CM | POA: Diagnosis not present

## 2022-11-11 DIAGNOSIS — R200 Anesthesia of skin: Secondary | ICD-10-CM | POA: Diagnosis not present

## 2022-11-11 DIAGNOSIS — D539 Nutritional anemia, unspecified: Secondary | ICD-10-CM | POA: Diagnosis not present

## 2022-11-11 DIAGNOSIS — R059 Cough, unspecified: Secondary | ICD-10-CM | POA: Diagnosis not present

## 2022-11-15 DIAGNOSIS — Z419 Encounter for procedure for purposes other than remedying health state, unspecified: Secondary | ICD-10-CM | POA: Diagnosis not present

## 2022-12-12 DIAGNOSIS — D509 Iron deficiency anemia, unspecified: Secondary | ICD-10-CM | POA: Diagnosis not present

## 2022-12-16 DIAGNOSIS — Z419 Encounter for procedure for purposes other than remedying health state, unspecified: Secondary | ICD-10-CM | POA: Diagnosis not present

## 2022-12-22 DIAGNOSIS — D508 Other iron deficiency anemias: Secondary | ICD-10-CM | POA: Diagnosis not present

## 2023-01-15 DIAGNOSIS — Z419 Encounter for procedure for purposes other than remedying health state, unspecified: Secondary | ICD-10-CM | POA: Diagnosis not present

## 2023-02-15 DIAGNOSIS — Z419 Encounter for procedure for purposes other than remedying health state, unspecified: Secondary | ICD-10-CM | POA: Diagnosis not present

## 2023-02-26 DIAGNOSIS — Z419 Encounter for procedure for purposes other than remedying health state, unspecified: Secondary | ICD-10-CM | POA: Diagnosis not present

## 2023-03-14 DIAGNOSIS — R042 Hemoptysis: Secondary | ICD-10-CM | POA: Diagnosis not present

## 2023-03-14 DIAGNOSIS — D509 Iron deficiency anemia, unspecified: Secondary | ICD-10-CM | POA: Diagnosis not present

## 2023-03-17 DIAGNOSIS — K219 Gastro-esophageal reflux disease without esophagitis: Secondary | ICD-10-CM | POA: Diagnosis not present

## 2023-03-17 DIAGNOSIS — R197 Diarrhea, unspecified: Secondary | ICD-10-CM | POA: Diagnosis not present

## 2023-03-17 DIAGNOSIS — R0602 Shortness of breath: Secondary | ICD-10-CM | POA: Diagnosis not present

## 2023-03-17 DIAGNOSIS — D509 Iron deficiency anemia, unspecified: Secondary | ICD-10-CM | POA: Diagnosis not present

## 2023-03-17 DIAGNOSIS — R042 Hemoptysis: Secondary | ICD-10-CM | POA: Diagnosis not present

## 2023-03-17 DIAGNOSIS — K5900 Constipation, unspecified: Secondary | ICD-10-CM | POA: Diagnosis not present

## 2023-03-18 DIAGNOSIS — Z419 Encounter for procedure for purposes other than remedying health state, unspecified: Secondary | ICD-10-CM | POA: Diagnosis not present

## 2023-04-15 DIAGNOSIS — Z419 Encounter for procedure for purposes other than remedying health state, unspecified: Secondary | ICD-10-CM | POA: Diagnosis not present

## 2023-04-17 DIAGNOSIS — K6389 Other specified diseases of intestine: Secondary | ICD-10-CM | POA: Diagnosis not present

## 2023-04-17 DIAGNOSIS — K3189 Other diseases of stomach and duodenum: Secondary | ICD-10-CM | POA: Diagnosis not present

## 2023-04-17 DIAGNOSIS — R197 Diarrhea, unspecified: Secondary | ICD-10-CM | POA: Diagnosis not present

## 2023-04-17 DIAGNOSIS — K648 Other hemorrhoids: Secondary | ICD-10-CM | POA: Diagnosis not present

## 2023-04-17 DIAGNOSIS — D509 Iron deficiency anemia, unspecified: Secondary | ICD-10-CM | POA: Diagnosis not present

## 2023-04-17 DIAGNOSIS — K219 Gastro-esophageal reflux disease without esophagitis: Secondary | ICD-10-CM | POA: Diagnosis not present

## 2023-05-23 DIAGNOSIS — R197 Diarrhea, unspecified: Secondary | ICD-10-CM | POA: Diagnosis not present

## 2023-05-27 DIAGNOSIS — Z419 Encounter for procedure for purposes other than remedying health state, unspecified: Secondary | ICD-10-CM | POA: Diagnosis not present

## 2023-06-26 DIAGNOSIS — Z419 Encounter for procedure for purposes other than remedying health state, unspecified: Secondary | ICD-10-CM | POA: Diagnosis not present

## 2023-07-27 DIAGNOSIS — Z419 Encounter for procedure for purposes other than remedying health state, unspecified: Secondary | ICD-10-CM | POA: Diagnosis not present

## 2023-08-26 DIAGNOSIS — Z419 Encounter for procedure for purposes other than remedying health state, unspecified: Secondary | ICD-10-CM | POA: Diagnosis not present

## 2023-09-14 DIAGNOSIS — D509 Iron deficiency anemia, unspecified: Secondary | ICD-10-CM | POA: Diagnosis not present

## 2023-09-14 DIAGNOSIS — R0683 Snoring: Secondary | ICD-10-CM | POA: Diagnosis not present

## 2023-09-14 DIAGNOSIS — R0982 Postnasal drip: Secondary | ICD-10-CM | POA: Diagnosis not present

## 2023-09-26 DIAGNOSIS — Z419 Encounter for procedure for purposes other than remedying health state, unspecified: Secondary | ICD-10-CM | POA: Diagnosis not present

## 2023-09-26 DIAGNOSIS — D508 Other iron deficiency anemias: Secondary | ICD-10-CM | POA: Diagnosis not present

## 2023-10-04 DIAGNOSIS — N898 Other specified noninflammatory disorders of vagina: Secondary | ICD-10-CM | POA: Diagnosis not present

## 2023-10-04 DIAGNOSIS — E611 Iron deficiency: Secondary | ICD-10-CM | POA: Diagnosis not present

## 2023-10-04 DIAGNOSIS — R5383 Other fatigue: Secondary | ICD-10-CM | POA: Diagnosis not present

## 2023-10-04 DIAGNOSIS — Z131 Encounter for screening for diabetes mellitus: Secondary | ICD-10-CM | POA: Diagnosis not present

## 2023-10-04 DIAGNOSIS — R0602 Shortness of breath: Secondary | ICD-10-CM | POA: Diagnosis not present

## 2023-10-04 DIAGNOSIS — N760 Acute vaginitis: Secondary | ICD-10-CM | POA: Diagnosis not present

## 2023-10-04 DIAGNOSIS — R110 Nausea: Secondary | ICD-10-CM | POA: Diagnosis not present

## 2023-10-04 DIAGNOSIS — Z6831 Body mass index (BMI) 31.0-31.9, adult: Secondary | ICD-10-CM | POA: Diagnosis not present

## 2023-10-04 DIAGNOSIS — B9689 Other specified bacterial agents as the cause of diseases classified elsewhere: Secondary | ICD-10-CM | POA: Diagnosis not present

## 2023-10-04 DIAGNOSIS — E559 Vitamin D deficiency, unspecified: Secondary | ICD-10-CM | POA: Diagnosis not present

## 2023-10-04 DIAGNOSIS — Z3200 Encounter for pregnancy test, result unknown: Secondary | ICD-10-CM | POA: Diagnosis not present

## 2023-10-27 DIAGNOSIS — Z419 Encounter for procedure for purposes other than remedying health state, unspecified: Secondary | ICD-10-CM | POA: Diagnosis not present

## 2023-11-06 DIAGNOSIS — Z6830 Body mass index (BMI) 30.0-30.9, adult: Secondary | ICD-10-CM | POA: Diagnosis not present

## 2023-11-06 DIAGNOSIS — E559 Vitamin D deficiency, unspecified: Secondary | ICD-10-CM | POA: Diagnosis not present

## 2023-11-06 DIAGNOSIS — F988 Other specified behavioral and emotional disorders with onset usually occurring in childhood and adolescence: Secondary | ICD-10-CM | POA: Diagnosis not present

## 2023-11-06 DIAGNOSIS — Z3200 Encounter for pregnancy test, result unknown: Secondary | ICD-10-CM | POA: Diagnosis not present

## 2023-11-06 DIAGNOSIS — E611 Iron deficiency: Secondary | ICD-10-CM | POA: Diagnosis not present

## 2023-11-06 DIAGNOSIS — R110 Nausea: Secondary | ICD-10-CM | POA: Diagnosis not present

## 2023-11-13 DIAGNOSIS — Z1331 Encounter for screening for depression: Secondary | ICD-10-CM | POA: Diagnosis not present

## 2023-11-13 DIAGNOSIS — R748 Abnormal levels of other serum enzymes: Secondary | ICD-10-CM | POA: Diagnosis not present

## 2023-11-13 DIAGNOSIS — Z1339 Encounter for screening examination for other mental health and behavioral disorders: Secondary | ICD-10-CM | POA: Diagnosis not present

## 2023-11-13 DIAGNOSIS — E611 Iron deficiency: Secondary | ICD-10-CM | POA: Diagnosis not present

## 2023-11-13 DIAGNOSIS — F988 Other specified behavioral and emotional disorders with onset usually occurring in childhood and adolescence: Secondary | ICD-10-CM | POA: Diagnosis not present

## 2023-11-13 DIAGNOSIS — Z0000 Encounter for general adult medical examination without abnormal findings: Secondary | ICD-10-CM | POA: Diagnosis not present

## 2023-11-13 DIAGNOSIS — Z6829 Body mass index (BMI) 29.0-29.9, adult: Secondary | ICD-10-CM | POA: Diagnosis not present

## 2023-11-13 DIAGNOSIS — Z3200 Encounter for pregnancy test, result unknown: Secondary | ICD-10-CM | POA: Diagnosis not present

## 2023-11-13 DIAGNOSIS — E559 Vitamin D deficiency, unspecified: Secondary | ICD-10-CM | POA: Diagnosis not present

## 2023-11-13 DIAGNOSIS — D539 Nutritional anemia, unspecified: Secondary | ICD-10-CM | POA: Diagnosis not present

## 2023-11-23 DIAGNOSIS — R748 Abnormal levels of other serum enzymes: Secondary | ICD-10-CM | POA: Diagnosis not present

## 2023-11-26 DIAGNOSIS — Z419 Encounter for procedure for purposes other than remedying health state, unspecified: Secondary | ICD-10-CM | POA: Diagnosis not present

## 2023-12-05 DIAGNOSIS — F988 Other specified behavioral and emotional disorders with onset usually occurring in childhood and adolescence: Secondary | ICD-10-CM | POA: Diagnosis not present

## 2023-12-05 DIAGNOSIS — Z6829 Body mass index (BMI) 29.0-29.9, adult: Secondary | ICD-10-CM | POA: Diagnosis not present

## 2023-12-05 DIAGNOSIS — K760 Fatty (change of) liver, not elsewhere classified: Secondary | ICD-10-CM | POA: Diagnosis not present

## 2023-12-05 DIAGNOSIS — E559 Vitamin D deficiency, unspecified: Secondary | ICD-10-CM | POA: Diagnosis not present

## 2023-12-05 DIAGNOSIS — Z3200 Encounter for pregnancy test, result unknown: Secondary | ICD-10-CM | POA: Diagnosis not present

## 2023-12-05 DIAGNOSIS — R3129 Other microscopic hematuria: Secondary | ICD-10-CM | POA: Diagnosis not present

## 2023-12-05 DIAGNOSIS — E611 Iron deficiency: Secondary | ICD-10-CM | POA: Diagnosis not present

## 2023-12-11 DIAGNOSIS — Z114 Encounter for screening for human immunodeficiency virus [HIV]: Secondary | ICD-10-CM | POA: Diagnosis not present

## 2023-12-11 DIAGNOSIS — Z7251 High risk heterosexual behavior: Secondary | ICD-10-CM | POA: Diagnosis not present

## 2023-12-11 DIAGNOSIS — R3129 Other microscopic hematuria: Secondary | ICD-10-CM | POA: Diagnosis not present

## 2023-12-11 DIAGNOSIS — Z3200 Encounter for pregnancy test, result unknown: Secondary | ICD-10-CM | POA: Diagnosis not present

## 2023-12-11 DIAGNOSIS — N390 Urinary tract infection, site not specified: Secondary | ICD-10-CM | POA: Diagnosis not present

## 2023-12-11 DIAGNOSIS — E559 Vitamin D deficiency, unspecified: Secondary | ICD-10-CM | POA: Diagnosis not present

## 2023-12-11 DIAGNOSIS — E611 Iron deficiency: Secondary | ICD-10-CM | POA: Diagnosis not present

## 2023-12-11 DIAGNOSIS — A499 Bacterial infection, unspecified: Secondary | ICD-10-CM | POA: Diagnosis not present

## 2023-12-11 DIAGNOSIS — Z6828 Body mass index (BMI) 28.0-28.9, adult: Secondary | ICD-10-CM | POA: Diagnosis not present

## 2023-12-11 DIAGNOSIS — N898 Other specified noninflammatory disorders of vagina: Secondary | ICD-10-CM | POA: Diagnosis not present

## 2023-12-11 DIAGNOSIS — K760 Fatty (change of) liver, not elsewhere classified: Secondary | ICD-10-CM | POA: Diagnosis not present

## 2023-12-13 DIAGNOSIS — R3129 Other microscopic hematuria: Secondary | ICD-10-CM | POA: Diagnosis not present

## 2023-12-26 DIAGNOSIS — R0683 Snoring: Secondary | ICD-10-CM | POA: Diagnosis not present

## 2023-12-26 DIAGNOSIS — G4719 Other hypersomnia: Secondary | ICD-10-CM | POA: Diagnosis not present

## 2023-12-26 DIAGNOSIS — R0681 Apnea, not elsewhere classified: Secondary | ICD-10-CM | POA: Diagnosis not present

## 2023-12-27 DIAGNOSIS — Z419 Encounter for procedure for purposes other than remedying health state, unspecified: Secondary | ICD-10-CM | POA: Diagnosis not present

## 2024-01-09 DIAGNOSIS — R12 Heartburn: Secondary | ICD-10-CM | POA: Diagnosis not present

## 2024-01-09 DIAGNOSIS — R3129 Other microscopic hematuria: Secondary | ICD-10-CM | POA: Diagnosis not present

## 2024-01-09 DIAGNOSIS — K760 Fatty (change of) liver, not elsewhere classified: Secondary | ICD-10-CM | POA: Diagnosis not present

## 2024-01-09 DIAGNOSIS — Z6829 Body mass index (BMI) 29.0-29.9, adult: Secondary | ICD-10-CM | POA: Diagnosis not present

## 2024-01-09 DIAGNOSIS — E611 Iron deficiency: Secondary | ICD-10-CM | POA: Diagnosis not present

## 2024-01-09 DIAGNOSIS — L659 Nonscarring hair loss, unspecified: Secondary | ICD-10-CM | POA: Diagnosis not present

## 2024-01-09 DIAGNOSIS — L609 Nail disorder, unspecified: Secondary | ICD-10-CM | POA: Diagnosis not present

## 2024-01-09 DIAGNOSIS — R7689 Other specified abnormal immunological findings in serum: Secondary | ICD-10-CM | POA: Diagnosis not present

## 2024-01-09 DIAGNOSIS — Z3200 Encounter for pregnancy test, result unknown: Secondary | ICD-10-CM | POA: Diagnosis not present

## 2024-01-09 DIAGNOSIS — E559 Vitamin D deficiency, unspecified: Secondary | ICD-10-CM | POA: Diagnosis not present

## 2024-01-26 DIAGNOSIS — Z419 Encounter for procedure for purposes other than remedying health state, unspecified: Secondary | ICD-10-CM | POA: Diagnosis not present

## 2024-02-28 ENCOUNTER — Ambulatory Visit: Admitting: Obstetrics and Gynecology

## 2024-02-28 ENCOUNTER — Other Ambulatory Visit (HOSPITAL_COMMUNITY)
Admission: RE | Admit: 2024-02-28 | Discharge: 2024-02-28 | Disposition: A | Source: Ambulatory Visit | Attending: Obstetrics and Gynecology | Admitting: Obstetrics and Gynecology

## 2024-02-28 ENCOUNTER — Encounter: Payer: Self-pay | Admitting: Obstetrics and Gynecology

## 2024-02-28 VITALS — BP 123/77 | HR 75 | Ht 60.0 in | Wt 153.0 lb

## 2024-02-28 DIAGNOSIS — R319 Hematuria, unspecified: Secondary | ICD-10-CM | POA: Diagnosis not present

## 2024-02-28 DIAGNOSIS — Z01419 Encounter for gynecological examination (general) (routine) without abnormal findings: Secondary | ICD-10-CM | POA: Insufficient documentation

## 2024-02-28 DIAGNOSIS — R3989 Other symptoms and signs involving the genitourinary system: Secondary | ICD-10-CM

## 2024-02-28 DIAGNOSIS — N898 Other specified noninflammatory disorders of vagina: Secondary | ICD-10-CM | POA: Insufficient documentation

## 2024-02-28 DIAGNOSIS — Z124 Encounter for screening for malignant neoplasm of cervix: Secondary | ICD-10-CM

## 2024-02-28 DIAGNOSIS — Z113 Encounter for screening for infections with a predominantly sexual mode of transmission: Secondary | ICD-10-CM

## 2024-02-28 DIAGNOSIS — Z3009 Encounter for other general counseling and advice on contraception: Secondary | ICD-10-CM

## 2024-02-28 LAB — POCT URINALYSIS DIPSTICK
Bilirubin, UA: NEGATIVE
Glucose, UA: NEGATIVE
Ketones, UA: NEGATIVE
Leukocytes, UA: NEGATIVE
Nitrite, UA: NEGATIVE
Protein, UA: POSITIVE — AB
Spec Grav, UA: >=1.03 — AB
Urobilinogen, UA: 0.2 U/dL
pH, UA: 6

## 2024-02-28 MED ORDER — NORGESTREL-ETHINYL ESTRADIOL 0.3-30 MG-MCG PO TABS: 1.0000 | ORAL_TABLET | Freq: Every day | ORAL | 11 refills | Status: AC
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
## 2024-03-01 LAB — CYTOLOGY - PAP
Chlamydia: NEGATIVE
Comment: NEGATIVE
Comment: NEGATIVE
Comment: NEGATIVE
Comment: NORMAL
Diagnosis: NEGATIVE
High risk HPV: NEGATIVE
Neisseria Gonorrhea: NEGATIVE
Trichomonas: NEGATIVE

## 2024-03-01 LAB — CERVICOVAGINAL ANCILLARY ONLY
Bacterial Vaginitis (gardnerella): POSITIVE — AB
Candida Glabrata: NEGATIVE
Candida Vaginitis: NEGATIVE
Comment: NEGATIVE
Comment: NEGATIVE
Comment: NEGATIVE

## 2024-03-01 LAB — URINE CULTURE: Organism ID, Bacteria: NO GROWTH

## 2024-03-05 ENCOUNTER — Ambulatory Visit: Payer: Self-pay | Admitting: Obstetrics and Gynecology

## 2024-03-05 MED ORDER — METRONIDAZOLE 500 MG PO TABS: 500.0000 mg | ORAL_TABLET | Freq: Two times a day (BID) | ORAL | 0 refills | Status: AC
